# Patient Record
Sex: Female | Born: 1965 | Race: White | Hispanic: No | Smoking: Never smoker
Health system: Southern US, Community
[De-identification: ages and names within clinical notes are randomized; demographics above are authoritative.]

## PROBLEM LIST (undated history)

## (undated) DIAGNOSIS — R51 Headache: Secondary | ICD-10-CM

## (undated) DIAGNOSIS — K589 Irritable bowel syndrome without diarrhea: Secondary | ICD-10-CM

## (undated) DIAGNOSIS — T7840XA Allergy, unspecified, initial encounter: Secondary | ICD-10-CM

## (undated) DIAGNOSIS — R519 Headache, unspecified: Secondary | ICD-10-CM

## (undated) HISTORY — DX: Allergy, unspecified, initial encounter: T78.40XA

## (undated) HISTORY — PX: TOTAL HIP ARTHROPLASTY: SHX124

## (undated) HISTORY — DX: Headache, unspecified: R51.9

## (undated) HISTORY — DX: Headache: R51

## (undated) HISTORY — PX: JOINT REPLACEMENT: SHX530

## (undated) HISTORY — DX: Irritable bowel syndrome, unspecified: K58.9

---

## 2004-01-20 ENCOUNTER — Ambulatory Visit (HOSPITAL_COMMUNITY): Admission: RE | Admit: 2004-01-20 | Discharge: 2004-01-20 | Payer: Self-pay | Admitting: Unknown Physician Specialty

## 2005-03-10 ENCOUNTER — Ambulatory Visit (HOSPITAL_COMMUNITY): Admission: RE | Admit: 2005-03-10 | Discharge: 2005-03-10 | Payer: Self-pay | Admitting: Unknown Physician Specialty

## 2006-01-19 ENCOUNTER — Emergency Department (HOSPITAL_COMMUNITY): Admission: EM | Admit: 2006-01-19 | Discharge: 2006-01-19 | Payer: Self-pay | Admitting: Emergency Medicine

## 2010-11-08 ENCOUNTER — Encounter: Payer: Self-pay | Admitting: Specialist

## 2014-05-09 ENCOUNTER — Encounter: Payer: Self-pay | Admitting: *Deleted

## 2014-11-28 LAB — LIPID PANEL
CHOLESTEROL: 215 — AB (ref 0–200)
HDL: 82 — AB (ref 35–70)
LDL Cholesterol: 123
TRIGLYCERIDES: 51 (ref 40–160)

## 2014-11-28 LAB — TSH: TSH: 1.53 (ref 0.41–5.90)

## 2014-11-28 LAB — BASIC METABOLIC PANEL
BUN: 14 (ref 4–21)
Creatinine: 0.8 (ref 0.5–1.1)
Glucose: 81
POTASSIUM: 5.6 — AB (ref 3.4–5.3)
SODIUM: 138 (ref 137–147)

## 2014-11-28 LAB — HEPATIC FUNCTION PANEL
ALT: 27 (ref 7–35)
AST: 22 (ref 13–35)
Alkaline Phosphatase: 36 (ref 25–125)
Bilirubin, Direct: 0.2 (ref 0.01–0.4)
Bilirubin, Total: 1.6

## 2014-11-28 LAB — HM PAP SMEAR: HM Pap smear: NORMAL

## 2016-04-26 DIAGNOSIS — M545 Low back pain: Secondary | ICD-10-CM | POA: Diagnosis not present

## 2016-04-26 DIAGNOSIS — M25551 Pain in right hip: Secondary | ICD-10-CM | POA: Diagnosis not present

## 2016-05-30 DIAGNOSIS — F4310 Post-traumatic stress disorder, unspecified: Secondary | ICD-10-CM | POA: Diagnosis not present

## 2016-06-10 DIAGNOSIS — F4310 Post-traumatic stress disorder, unspecified: Secondary | ICD-10-CM | POA: Diagnosis not present

## 2016-06-13 DIAGNOSIS — F4310 Post-traumatic stress disorder, unspecified: Secondary | ICD-10-CM | POA: Diagnosis not present

## 2016-06-20 DIAGNOSIS — F4310 Post-traumatic stress disorder, unspecified: Secondary | ICD-10-CM | POA: Diagnosis not present

## 2016-07-04 DIAGNOSIS — F4310 Post-traumatic stress disorder, unspecified: Secondary | ICD-10-CM | POA: Diagnosis not present

## 2016-07-11 DIAGNOSIS — F4310 Post-traumatic stress disorder, unspecified: Secondary | ICD-10-CM | POA: Diagnosis not present

## 2016-07-18 DIAGNOSIS — F4310 Post-traumatic stress disorder, unspecified: Secondary | ICD-10-CM | POA: Diagnosis not present

## 2016-08-01 DIAGNOSIS — F4310 Post-traumatic stress disorder, unspecified: Secondary | ICD-10-CM | POA: Diagnosis not present

## 2016-08-15 DIAGNOSIS — F4310 Post-traumatic stress disorder, unspecified: Secondary | ICD-10-CM | POA: Diagnosis not present

## 2016-08-29 DIAGNOSIS — F4310 Post-traumatic stress disorder, unspecified: Secondary | ICD-10-CM | POA: Diagnosis not present

## 2016-09-05 DIAGNOSIS — F4310 Post-traumatic stress disorder, unspecified: Secondary | ICD-10-CM | POA: Diagnosis not present

## 2016-09-13 DIAGNOSIS — F4310 Post-traumatic stress disorder, unspecified: Secondary | ICD-10-CM | POA: Diagnosis not present

## 2016-09-20 DIAGNOSIS — F4310 Post-traumatic stress disorder, unspecified: Secondary | ICD-10-CM | POA: Diagnosis not present

## 2016-09-27 DIAGNOSIS — F4310 Post-traumatic stress disorder, unspecified: Secondary | ICD-10-CM | POA: Diagnosis not present

## 2016-10-05 DIAGNOSIS — F4310 Post-traumatic stress disorder, unspecified: Secondary | ICD-10-CM | POA: Diagnosis not present

## 2016-10-12 DIAGNOSIS — F4310 Post-traumatic stress disorder, unspecified: Secondary | ICD-10-CM | POA: Diagnosis not present

## 2016-10-18 DIAGNOSIS — F4310 Post-traumatic stress disorder, unspecified: Secondary | ICD-10-CM | POA: Diagnosis not present

## 2016-10-24 DIAGNOSIS — F4310 Post-traumatic stress disorder, unspecified: Secondary | ICD-10-CM | POA: Diagnosis not present

## 2016-11-14 DIAGNOSIS — F4310 Post-traumatic stress disorder, unspecified: Secondary | ICD-10-CM | POA: Diagnosis not present

## 2016-11-21 DIAGNOSIS — F4310 Post-traumatic stress disorder, unspecified: Secondary | ICD-10-CM | POA: Diagnosis not present

## 2016-11-28 DIAGNOSIS — F4310 Post-traumatic stress disorder, unspecified: Secondary | ICD-10-CM | POA: Diagnosis not present

## 2016-12-05 DIAGNOSIS — F4310 Post-traumatic stress disorder, unspecified: Secondary | ICD-10-CM | POA: Diagnosis not present

## 2016-12-06 DIAGNOSIS — J329 Chronic sinusitis, unspecified: Secondary | ICD-10-CM | POA: Diagnosis not present

## 2016-12-06 DIAGNOSIS — H6591 Unspecified nonsuppurative otitis media, right ear: Secondary | ICD-10-CM | POA: Diagnosis not present

## 2016-12-12 DIAGNOSIS — F4310 Post-traumatic stress disorder, unspecified: Secondary | ICD-10-CM | POA: Diagnosis not present

## 2016-12-19 DIAGNOSIS — F4310 Post-traumatic stress disorder, unspecified: Secondary | ICD-10-CM | POA: Diagnosis not present

## 2017-01-02 DIAGNOSIS — F4310 Post-traumatic stress disorder, unspecified: Secondary | ICD-10-CM | POA: Diagnosis not present

## 2017-01-09 DIAGNOSIS — F4310 Post-traumatic stress disorder, unspecified: Secondary | ICD-10-CM | POA: Diagnosis not present

## 2017-01-16 DIAGNOSIS — F4310 Post-traumatic stress disorder, unspecified: Secondary | ICD-10-CM | POA: Diagnosis not present

## 2017-01-23 DIAGNOSIS — F4310 Post-traumatic stress disorder, unspecified: Secondary | ICD-10-CM | POA: Diagnosis not present

## 2017-01-30 DIAGNOSIS — F4310 Post-traumatic stress disorder, unspecified: Secondary | ICD-10-CM | POA: Diagnosis not present

## 2017-02-06 DIAGNOSIS — F4310 Post-traumatic stress disorder, unspecified: Secondary | ICD-10-CM | POA: Diagnosis not present

## 2017-02-20 DIAGNOSIS — F4310 Post-traumatic stress disorder, unspecified: Secondary | ICD-10-CM | POA: Diagnosis not present

## 2017-02-27 DIAGNOSIS — F4310 Post-traumatic stress disorder, unspecified: Secondary | ICD-10-CM | POA: Diagnosis not present

## 2017-03-03 ENCOUNTER — Ambulatory Visit: Payer: Self-pay | Admitting: Sports Medicine

## 2017-03-06 DIAGNOSIS — F4310 Post-traumatic stress disorder, unspecified: Secondary | ICD-10-CM | POA: Diagnosis not present

## 2017-03-07 ENCOUNTER — Ambulatory Visit (INDEPENDENT_AMBULATORY_CARE_PROVIDER_SITE_OTHER): Payer: BLUE CROSS/BLUE SHIELD | Admitting: Sports Medicine

## 2017-03-07 ENCOUNTER — Encounter: Payer: Self-pay | Admitting: Sports Medicine

## 2017-03-07 VITALS — BP 120/80 | HR 69 | Ht 66.0 in | Wt 161.4 lb

## 2017-03-07 DIAGNOSIS — M545 Low back pain, unspecified: Secondary | ICD-10-CM

## 2017-03-07 DIAGNOSIS — R5383 Other fatigue: Secondary | ICD-10-CM | POA: Insufficient documentation

## 2017-03-07 DIAGNOSIS — M9904 Segmental and somatic dysfunction of sacral region: Secondary | ICD-10-CM | POA: Diagnosis not present

## 2017-03-07 DIAGNOSIS — R5382 Chronic fatigue, unspecified: Secondary | ICD-10-CM | POA: Diagnosis not present

## 2017-03-07 DIAGNOSIS — M9903 Segmental and somatic dysfunction of lumbar region: Secondary | ICD-10-CM

## 2017-03-07 DIAGNOSIS — M9905 Segmental and somatic dysfunction of pelvic region: Secondary | ICD-10-CM

## 2017-03-07 NOTE — Patient Instructions (Signed)
Please perform the exercise program that Jeneen Rinks has prepared for you and gone over in detail on a daily basis.  In addition to the handout you were provided you can access your program through: [email or web address]   Your unique program code is: [email or web address]

## 2017-03-07 NOTE — Progress Notes (Signed)
OFFICE VISIT NOTE Emily Meyer. Meyer, Emily at [facility] [phone]  Emily Meyer - 51 y.o. female MRN [id number]  Date of birth: [birth year]  Visit Date: 03/07/2017  PCP: No primary care provider on file.   Referred by: No ref. provider found  Emily Meyer, CMA acting as scribe for Dr. Paulla Meyer.  SUBJECTIVE:   Chief Complaint  Patient presents with  . Follow-up    low back pain with sciatica   HPI: As below and per problem based documentation when appropriate.  Pt presents today in follow-up of low back pain with sciatica.  Pain started a couple pf months ago.  Pt reports no known injury or trauma.   The pain is described as dull pain in legs and sharp pain in lower back. Pain is rated as 5/10 but varies.  Worsened with standing at work. Legs tend to ache at night. Her legs also ache when sitting.  Improves with 2 different exercises. These provide temporary relief.  Pt is not currently taking any medication to help with the pain.   Other associated symptoms include: none  Pt denies fever, chills. She has night sweats but associates that with hormones.     Review of Systems  Constitutional: Negative for chills and fever.  HENT: Positive for tinnitus.   Respiratory: Negative for shortness of breath and wheezing.   Cardiovascular: Positive for leg swelling (slightly, legs feel heavy). Negative for chest pain.  Musculoskeletal: Positive for back pain. Negative for falls.  Neurological: Positive for dizziness, tingling (feet/toes) and headaches.  Endo/Heme/Allergies: Does not bruise/bleed easily.    Otherwise per HPI.  HISTORY & PERTINENT PRIOR DATA:  No specialty comments available. She reports that she has never smoked. She has never used smokeless tobacco. No results for input(s): HGBA1C, LABURIC in the last 8760 hours. Medications & Allergies reviewed per EMR Patient Active Problem List   Diagnosis Date Noted  . Acute  bilateral low back pain without sciatica 03/07/2017  . Fatigue 03/07/2017   Past Medical History:  Diagnosis Date  . IBS (irritable bowel syndrome)    Family History  Problem Relation Age of Onset  . Hypertension Father   . Diabetes Brother   . Cancer Paternal Aunt        breast   No past surgical history on file. Social History   Occupational History  . Not on file.   Social History Main Topics  . Smoking status: Never Smoker  . Smokeless tobacco: Never Used  . Alcohol use Not on file  . Drug use: Unknown  . Sexual activity: Not on file    OBJECTIVE:  VS:  HT:5\' 6"  (167.6 cm)   WT:161 lb 6.4 oz (73.2 kg)  BMI:26.1    BP:120/80  HR:69bpm  TEMP: ( )  RESP:98 % EXAM: Findings:  WDWN, NAD, Non-toxic appearing Alert & appropriately interactive Not depressed or anxious appearing No increased work of breathing. Pupils are equal. EOM intact without nystagmus No clubbing or cyanosis of the extremities appreciated No significant rashes/lesions/ulcerations overlying the examined area. DP & PT pulses 2+/4.  No significant pretibial edema.  No clubbing or cyanosis Sensation intact to light touch in lower extremities.  Back & Lower Extremities: Bilateral negative straight leg raise.  Although tight. No significant midline tenderness.   TTP over bilateral paraspinal musculature.  Left greater than right Good internal and external rotation of the hips.  OSTEOPATHIC/STRUCTURAL EXAM:   L5 FRS right L3 FRS  left Left on left sacral torsion Right anterior innominate       No results found. ASSESSMENT & PLAN:   Problem List Items Addressed This Visit    Acute bilateral low back pain without sciatica - Primary    Back pain is consistent with functional axial pain.  No red flag symptoms.  PROCEDURE NOTE : OSTEOPATHIC MANIPULATION The decision today to treat with Osteopathic Manipulative Therapy (OMT) was based on physical exam findings. Verbal consent was obtained  after after explanation of risks, benefits and potential side effects, including acute pain flare, post manipulation soreness and need for repeat treatments.  If Cervical manipulation was performed additional time was spent discussing the associated minimal risk of  injury to neurovascular structures.  After consent was obtained manipulation was performed as below:            Regions treated:  Per billing codes          Techniques used:  Direct, Muscle Energy, MFR, HVLA (long lever) and ART The patient tolerated the treatment well and reported Improved symptoms following treatment today. Patient was given medications, exercises, stretches and lifestyle modifications per AVS and verbally.     +++++++++++++++++++++++++++++++++++++++++++++++++++++++++++++++ PROCEDURE NOTE: THERAPEUTIC EXERCISES (97110) 15 minutes spent for Therapeutic exercises as stated in above notes.  This included exercises focusing on stretching, strengthening, focus on core stabilization, thoracic mobility and hip abduction strengthening.  Proper technique shown and discussed handout in great detail with ATC.  All questions were discussed and answered.        Fatigue    Patient had worsening fatigue over the past several months as well as worsening MSK symptoms diffusely.  I believe this is likely due to functional issues but do think it is prudent to rule out metabolic causes.  Labs as below.       Relevant Orders   CBC with Differential/Platelet (Completed)   Comprehensive metabolic panel (Completed)   VITAMIN D 25 Hydroxy (Vit-D Deficiency, Fractures) (Completed)   Ferritin (Completed)    Other Visit Diagnoses    Somatic dysfunction of lumbar region       Somatic dysfunction of sacral region       Somatic dysfunction of pelvis region          Follow-up: Return in about 4 weeks (around 04/04/2017).   CMA/ATC served as Education administrator during this visit. History, Physical, and Plan performed by medical provider. Documentation  and orders reviewed and attested to.      [name], [facility] Physician

## 2017-03-08 LAB — COMPREHENSIVE METABOLIC PANEL
ALT: 31 U/L (ref 0–35)
AST: 24 U/L (ref 0–37)
Albumin: 4.4 g/dL (ref 3.5–5.2)
Alkaline Phosphatase: 32 U/L — ABNORMAL LOW (ref 39–117)
BUN: 22 mg/dL (ref 6–23)
CHLORIDE: 107 meq/L (ref 96–112)
CO2: 24 meq/L (ref 19–32)
Calcium: 9.2 mg/dL (ref 8.4–10.5)
Creatinine, Ser: 0.88 mg/dL (ref 0.40–1.20)
GFR: 71.73 mL/min (ref >60.00)
GLUCOSE: 89 mg/dL (ref 70–99)
POTASSIUM: 4.7 meq/L (ref 3.5–5.1)
SODIUM: 139 meq/L (ref 135–145)
TOTAL PROTEIN: 7 g/dL (ref 6.0–8.3)
Total Bilirubin: 0.7 mg/dL (ref 0.2–1.2)

## 2017-03-08 LAB — CBC WITH DIFFERENTIAL/PLATELET
BASOS PCT: 0.9 % (ref 0.0–3.0)
Basophils Absolute: 0.1 10*3/uL (ref 0.0–0.1)
EOS PCT: 0.8 % (ref 0.0–5.0)
Eosinophils Absolute: 0.1 10*3/uL (ref 0.0–0.7)
HEMATOCRIT: 38.1 % (ref 36.0–46.0)
HEMOGLOBIN: 12.8 g/dL (ref 12.0–15.0)
LYMPHS PCT: 24.9 % (ref 12.0–46.0)
Lymphs Abs: 2.2 10*3/uL (ref 0.7–4.0)
MCHC: 33.6 g/dL (ref 30.0–36.0)
MCV: 93 fl (ref 78.0–100.0)
MONO ABS: 0.6 10*3/uL (ref 0.1–1.0)
MONOS PCT: 7.1 % (ref 3.0–12.0)
Neutro Abs: 5.7 10*3/uL (ref 1.4–7.7)
Neutrophils Relative %: 66.3 % (ref 43.0–77.0)
Platelets: 212 10*3/uL (ref 150.0–400.0)
RBC: 4.1 Mil/uL (ref 3.87–5.11)
RDW: 13.5 % (ref 11.5–15.5)
WBC: 8.7 10*3/uL (ref 4.0–10.5)

## 2017-03-08 LAB — FERRITIN: Ferritin: 23.9 ng/mL (ref 10.0–291.0)

## 2017-03-08 LAB — VITAMIN D 25 HYDROXY (VIT D DEFICIENCY, FRACTURES): VITD: 21.54 ng/mL — ABNORMAL LOW (ref 30.00–100.00)

## 2017-03-09 ENCOUNTER — Telehealth: Payer: Self-pay

## 2017-03-09 MED ORDER — CHOLECALCIFEROL 1.25 MG (50000 UT) PO TABS: 1.0000 | ORAL_TABLET | ORAL | 0 refills | Status: DC

## 2017-03-09 NOTE — Telephone Encounter (Signed)
-----   Message from Gerda Diss, DO sent at 03/09/2017  8:19 AM EDT ----- Vitamin D is low but otherwise labs look good.   Iron is normal.  Needs to start once weekly Vitamin D, 50,000 IU once weekly. I will send to her pharmacy. Should stop her once daily Vit D  Please call and let her know about the results as well as the new rx  Needs f/u Vit D check in 12 weeks

## 2017-03-09 NOTE — Telephone Encounter (Signed)
Called pt. See telephone encounter made ~905am on 03/09/17. Also discussed potential mods to ATC exercises given at last visit. Patient has not yet made f/u appt I notified her of time we would like to see her she will call back when she is ready to schedule.

## 2017-03-13 NOTE — Assessment & Plan Note (Signed)
Patient had worsening fatigue over the past several months as well as worsening MSK symptoms diffusely.  I believe this is likely due to functional issues but do think it is prudent to rule out metabolic causes.  Labs as below.

## 2017-03-13 NOTE — Assessment & Plan Note (Signed)
Back pain is consistent with functional axial pain.  No red flag symptoms.  PROCEDURE NOTE : OSTEOPATHIC MANIPULATION The decision today to treat with Osteopathic Manipulative Therapy (OMT) was based on physical exam findings. Verbal consent was obtained after after explanation of risks, benefits and potential side effects, including acute pain flare, post manipulation soreness and need for repeat treatments.  If Cervical manipulation was performed additional time was spent discussing the associated minimal risk of  injury to neurovascular structures.  After consent was obtained manipulation was performed as below:            Regions treated:  Per billing codes          Techniques used:  Direct, Muscle Energy, MFR, HVLA (long lever) and ART The patient tolerated the treatment well and reported Improved symptoms following treatment today. Patient was given medications, exercises, stretches and lifestyle modifications per AVS and verbally.     +++++++++++++++++++++++++++++++++++++++++++++++++++++++++++++++ PROCEDURE NOTE: THERAPEUTIC EXERCISES (97110) 15 minutes spent for Therapeutic exercises as stated in above notes.  This included exercises focusing on stretching, strengthening, focus on core stabilization, thoracic mobility and hip abduction strengthening.  Proper technique shown and discussed handout in great detail with ATC.  All questions were discussed and answered.

## 2017-03-16 ENCOUNTER — Telehealth: Payer: Self-pay | Admitting: Emergency Medicine

## 2017-03-16 ENCOUNTER — Encounter: Payer: Self-pay | Admitting: Emergency Medicine

## 2017-03-16 NOTE — Telephone Encounter (Signed)
Patient declines any immunization   Not ready to get a mammogram or colonoscopy   Patient states last PAP Smear was several years ago and is willing to have done in office.

## 2017-03-17 ENCOUNTER — Ambulatory Visit (INDEPENDENT_AMBULATORY_CARE_PROVIDER_SITE_OTHER): Payer: BLUE CROSS/BLUE SHIELD | Admitting: Family Medicine

## 2017-03-17 ENCOUNTER — Encounter: Payer: Self-pay | Admitting: Family Medicine

## 2017-03-17 VITALS — HR 67 | Temp 97.9°F | Ht 66.0 in | Wt 157.4 lb

## 2017-03-17 DIAGNOSIS — H6981 Other specified disorders of Eustachian tube, right ear: Secondary | ICD-10-CM

## 2017-03-17 DIAGNOSIS — H6991 Unspecified Eustachian tube disorder, right ear: Secondary | ICD-10-CM

## 2017-03-17 DIAGNOSIS — H9201 Otalgia, right ear: Secondary | ICD-10-CM | POA: Diagnosis not present

## 2017-03-17 DIAGNOSIS — H9311 Tinnitus, right ear: Secondary | ICD-10-CM | POA: Diagnosis not present

## 2017-03-17 DIAGNOSIS — H9313 Tinnitus, bilateral: Secondary | ICD-10-CM | POA: Insufficient documentation

## 2017-03-17 MED ORDER — AZITHROMYCIN 500 MG PO TABS: 500.0000 mg | ORAL_TABLET | Freq: Every day | ORAL | 0 refills | Status: DC

## 2017-03-17 MED ORDER — PREDNISONE 5 MG PO TABS: ORAL_TABLET | ORAL | 0 refills | Status: DC

## 2017-03-17 NOTE — Progress Notes (Signed)
Emily Meyer is a 51 y.o. female is here to [facility].   Patient Care Team: Briscoe Deutscher, DO as PCP - General (Family Medicine)   History of Present Illness:   Emily Meyer CMA acting as scribe for Dr. Juleen China.  HPI Patient comes in today to establish care. She is having some right ear ringing that she first noticed in November 2017. She was having some ear pain that radiated down the right face, but that has gotten better. It has caused some headache on the right temporal area. She has been on prednisone and antibiotics that has not helped in the past.   Otalgia Patient presents with right ear pain. The patient reports chronic sinus infections for a few weeks.  Her symptoms include nasal congestion, sniffing, frequent clearing of the throat, facial pain.  There has not been a history of cough, fevers, sore throats, epistaxis, spitting/vomiting mucous. There has not been a history of chronic otitis media or pharyngotonsillitis.  Prior antibiotic therapy has included Amoxicillin. Other medications have included Flonase, Zyrtec, and Prednisone.  She has not had allergy testing.  Health Maintenance Due  Topic Date Due  . HIV Screening  03/19/1980  . TETANUS/TDAP  03/19/1984  . PAP SMEAR  03/19/1986  . MAMMOGRAM  03/20/2015  . COLONOSCOPY  03/20/2015    PMHx, SurgHx, SocialHx, Medications, and Allergies were reviewed in the Visit Navigator and updated as appropriate.   Past Medical History:  Diagnosis Date  . IBS (irritable bowel syndrome)    No past surgical history on file.  Family History  Problem Relation Age of Onset  . Hypertension Father   . Diabetes Brother   . Cancer Paternal Aunt        breast   Social History  Substance Use Topics  . Smoking status: Never Smoker  . Smokeless tobacco: Never Used  . Alcohol use No    Current Medications and Allergies:   .  b complex vitamins tablet, Take 1 tablet by mouth daily., Disp: , Rfl:  .  Cholecalciferol 50000  units TABS, Take 1 tablet by mouth once a week., Disp: 12 tablet, Rfl: 0 .  Flaxseed, Linseed, (EQL FLAX SEED OIL) 1000 MG CAPS, Take 1,000 mg by mouth 2 (two) times daily., Disp: , Rfl:  .  Magnesium Oxide (MAG-CAPS PO), Take 1 capsule by mouth daily., Disp: , Rfl:  .  vitamin A 10000 UNIT capsule, Take 10,000 Units by mouth daily., Disp: , Rfl:   Allergies  Allergen Reactions  . Adhesive [Tape]     rash   Review of Systems:   Review of Systems  Constitutional: Positive for malaise/fatigue. Negative for chills and fever.  HENT: Positive for ear pain. Negative for sinus pain and sore throat.   Eyes: Negative for blurred vision and double vision.  Respiratory: Negative for cough, shortness of breath and wheezing.   Cardiovascular: Negative for chest pain, palpitations and leg swelling.  Gastrointestinal: Negative for abdominal pain, nausea and vomiting.  Musculoskeletal: Negative for back pain, joint pain and neck pain.  Neurological: Positive for dizziness and headaches.  Psychiatric/Behavioral: Negative for depression, hallucinations and memory loss.    Vitals:   Vitals:   03/17/17 0739  Pulse: 67  Temp: 97.9 F (36.6 C)  TempSrc: Oral  SpO2: 99%  Weight: 157 lb 6.4 oz (71.4 kg)  Height: 5\' 6"  (1.676 m)     Body mass index is 25.41 kg/m.  Physical Exam:   Physical Exam  Results for  orders placed or performed in visit on 03/07/17  CBC with Differential/Platelet  Result Value Ref Range   WBC 8.7 4.0 - 10.5 K/uL   RBC 4.10 3.87 - 5.11 Mil/uL   Hemoglobin 12.8 12.0 - 15.0 g/dL   HCT 38.1 36.0 - 46.0 %   MCV 93.0 78.0 - 100.0 fl   MCHC 33.6 30.0 - 36.0 g/dL   RDW 13.5 11.5 - 15.5 %   Platelets 212.0 150.0 - 400.0 K/uL   Neutrophils Relative % 66.3 43.0 - 77.0 %   Lymphocytes Relative 24.9 12.0 - 46.0 %   Monocytes Relative 7.1 3.0 - 12.0 %   Eosinophils Relative 0.8 0.0 - 5.0 %   Basophils Relative 0.9 0.0 - 3.0 %   Neutro Abs 5.7 1.4 - 7.7 K/uL   Lymphs Abs  2.2 0.7 - 4.0 K/uL   Monocytes Absolute 0.6 0.1 - 1.0 K/uL   Eosinophils Absolute 0.1 0.0 - 0.7 K/uL   Basophils Absolute 0.1 0.0 - 0.1 K/uL  Comprehensive metabolic panel  Result Value Ref Range   Sodium 139 135 - 145 mEq/L   Potassium 4.7 3.5 - 5.1 mEq/L   Chloride 107 96 - 112 mEq/L   CO2 24 19 - 32 mEq/L   Glucose, Bld 89 70 - 99 mg/dL   BUN 22 6 - 23 mg/dL   Creatinine, Ser 0.88 0.40 - 1.20 mg/dL   Total Bilirubin 0.7 0.2 - 1.2 mg/dL   Alkaline Phosphatase 32 (L) 39 - 117 U/L   AST 24 0 - 37 U/L   ALT 31 0 - 35 U/L   Total Protein 7.0 6.0 - 8.3 g/dL   Albumin 4.4 3.5 - 5.2 g/dL   Calcium 9.2 8.4 - 10.5 mg/dL   GFR 71.73 >60.00 mL/min  VITAMIN D 25 Hydroxy (Vit-D Deficiency, Fractures)  Result Value Ref Range   VITD 21.54 (L) 30.00 - 100.00 ng/mL  Ferritin  Result Value Ref Range   Ferritin 23.9 10.0 - 291.0 ng/mL    Assessment and Plan:   Hibo was seen today for establish care and tinnitus.  Diagnoses and all orders for this visit:  Otalgia of right ear Comments: We reviewed symptomatic care and red flags.   Tinnitus of right ear Comments: Hearing tested today and WNL.  ETD (Eustachian tube dysfunction), right Comments: We reviewed environmental precautions, OTC medications, red flags. To ENT if not improving.  Orders: -     azithromycin (ZITHROMAX) 500 MG tablet; Take 1 tablet (500 mg total) by mouth daily. -     predniSONE (DELTASONE) 5 MG tablet; 6-5-4-3-2-1-off    . Reviewed expectations re: course of current medical issues. . Discussed self-management of symptoms. . Outlined signs and symptoms indicating need for more acute intervention. . Patient verbalized understanding and all questions were answered. Marland Kitchen Health Maintenance issues including appropriate healthy diet, exercise, and smoking avoidance were discussed with patient. . See orders for this visit as documented in the electronic medical record. . Patient received an After Visit  Summary.  CMA served as Education administrator during this visit. History, Physical, and Plan performed by medical provider. The above documentation has been reviewed and is accurate and complete. Briscoe Deutscher, D.O.  Briscoe Deutscher, DO [place] [place] 03/17/2017  No future appointments.

## 2017-03-20 ENCOUNTER — Telehealth: Payer: Self-pay | Admitting: Family Medicine

## 2017-03-20 NOTE — Telephone Encounter (Signed)
ROI fax to [facility] °

## 2017-03-27 DIAGNOSIS — F4310 Post-traumatic stress disorder, unspecified: Secondary | ICD-10-CM | POA: Diagnosis not present

## 2017-04-04 ENCOUNTER — Encounter: Payer: Self-pay | Admitting: Family Medicine

## 2017-04-04 LAB — T3, FREE: FREE T3: 2.96

## 2017-04-04 LAB — T4: Thyroxine (T4): 7.5

## 2017-04-10 DIAGNOSIS — F4310 Post-traumatic stress disorder, unspecified: Secondary | ICD-10-CM | POA: Diagnosis not present

## 2017-04-24 DIAGNOSIS — F4310 Post-traumatic stress disorder, unspecified: Secondary | ICD-10-CM | POA: Diagnosis not present

## 2017-05-19 ENCOUNTER — Other Ambulatory Visit: Payer: Self-pay

## 2017-05-19 ENCOUNTER — Telehealth: Payer: Self-pay | Admitting: Sports Medicine

## 2017-05-19 ENCOUNTER — Telehealth: Payer: Self-pay | Admitting: Family Medicine

## 2017-05-19 DIAGNOSIS — E559 Vitamin D deficiency, unspecified: Secondary | ICD-10-CM

## 2017-05-19 DIAGNOSIS — H9201 Otalgia, right ear: Secondary | ICD-10-CM

## 2017-05-19 NOTE — Telephone Encounter (Signed)
Patient wants to know if she can just sched a lab visit to check Vit D level, or if Dr. [name] wants to see her when it's time for her to check it?  Thank you,  -LL

## 2017-05-19 NOTE — Telephone Encounter (Signed)
OK to schedule visit for LAB only unless pt needs/wants to see Dr. [name].

## 2017-05-19 NOTE — Telephone Encounter (Signed)
Wants a referral to ENT as well as a suggested location.   Please call back at work number I've listed.  Thank you,  -LL

## 2017-05-19 NOTE — Telephone Encounter (Signed)
I've place an ENT referral.  Unsure where she will be referred to.  Would you mind calling the patient to advise her of location?  Thanks!

## 2017-05-22 DIAGNOSIS — F4310 Post-traumatic stress disorder, unspecified: Secondary | ICD-10-CM | POA: Diagnosis not present

## 2017-05-23 NOTE — Telephone Encounter (Signed)
Patient unfortunately never received an update on this referral. Patient lives in [place] and works near the [employer] office. Patient would like a referral as soon as possible to an ENT due to the problem getting worse. Call patient with any further questions and to keep updated.

## 2017-05-23 NOTE — Telephone Encounter (Signed)
Future orders have been placed.

## 2017-05-23 NOTE — Telephone Encounter (Signed)
Can you please help with this referral?  ?

## 2017-05-23 NOTE — Telephone Encounter (Signed)
Patient was unfortunately never updated on the note below, patient will call when she is ready to do the labs for her vitamin D levels.

## 2017-06-05 DIAGNOSIS — F4310 Post-traumatic stress disorder, unspecified: Secondary | ICD-10-CM | POA: Diagnosis not present

## 2017-06-15 ENCOUNTER — Other Ambulatory Visit: Payer: Self-pay | Admitting: Otolaryngology

## 2017-06-15 DIAGNOSIS — J324 Chronic pansinusitis: Secondary | ICD-10-CM

## 2017-06-15 DIAGNOSIS — H9313 Tinnitus, bilateral: Secondary | ICD-10-CM | POA: Diagnosis not present

## 2017-06-15 DIAGNOSIS — R1314 Dysphagia, pharyngoesophageal phase: Secondary | ICD-10-CM | POA: Diagnosis not present

## 2017-06-15 DIAGNOSIS — R42 Dizziness and giddiness: Secondary | ICD-10-CM | POA: Diagnosis not present

## 2017-06-21 DIAGNOSIS — F4310 Post-traumatic stress disorder, unspecified: Secondary | ICD-10-CM | POA: Diagnosis not present

## 2017-06-27 DIAGNOSIS — R51 Headache: Secondary | ICD-10-CM | POA: Diagnosis not present

## 2017-07-24 DIAGNOSIS — F4310 Post-traumatic stress disorder, unspecified: Secondary | ICD-10-CM | POA: Diagnosis not present

## 2017-08-07 DIAGNOSIS — F4310 Post-traumatic stress disorder, unspecified: Secondary | ICD-10-CM | POA: Diagnosis not present

## 2017-08-14 DIAGNOSIS — F4310 Post-traumatic stress disorder, unspecified: Secondary | ICD-10-CM | POA: Diagnosis not present

## 2017-08-21 DIAGNOSIS — F4310 Post-traumatic stress disorder, unspecified: Secondary | ICD-10-CM | POA: Diagnosis not present

## 2017-09-04 DIAGNOSIS — F4310 Post-traumatic stress disorder, unspecified: Secondary | ICD-10-CM | POA: Diagnosis not present

## 2017-09-20 DIAGNOSIS — F4310 Post-traumatic stress disorder, unspecified: Secondary | ICD-10-CM | POA: Diagnosis not present

## 2017-10-02 DIAGNOSIS — F4310 Post-traumatic stress disorder, unspecified: Secondary | ICD-10-CM | POA: Diagnosis not present

## 2017-10-04 ENCOUNTER — Ambulatory Visit: Payer: BLUE CROSS/BLUE SHIELD | Admitting: Neurology

## 2017-10-05 ENCOUNTER — Encounter: Payer: Self-pay | Admitting: Neurology

## 2017-10-05 ENCOUNTER — Ambulatory Visit: Payer: BLUE CROSS/BLUE SHIELD | Admitting: Neurology

## 2017-10-05 DIAGNOSIS — R51 Headache: Secondary | ICD-10-CM | POA: Diagnosis not present

## 2017-10-05 DIAGNOSIS — R519 Headache, unspecified: Secondary | ICD-10-CM | POA: Insufficient documentation

## 2017-10-05 NOTE — Progress Notes (Signed)
PATIENT: Emily Meyer DOB: 08-Jun-1966  Chief Complaint  Patient presents with  . Trigeminal Neuralgia    She is here due to right-sided facial pain, ringing in ear and headaches unresolved with steroids and antibiotics.  . Otolaryngology    Melissa Montane, MD (referring MD) --- Briscoe Deutscher, DO (PCP)     HISTORICAL  Emily Meyer is a 51 years old female, seen in refer by ENT Dr. Janace Hoard, Jenny Reichmann for evaluation of right facial pressure initial evaluation was on October 05, 2017  Since 2017, she began to notice right ear deep achy pain that eventually improved, afterwards, she also noticed tinnitus at the right ear, pressure sensation in her right cheekbone, drainage at right posterior oropharyngeal, symptoms fluctuate sometimes to the point of nausea, overall her symptoms has much improved,  She denies visual loss, no chewing difficulty, was seen by ENT, no significant abnormality found,  MRI of the brain with and without contrast at [place] [facility] on June 28, 2017, there was no acute abnormality  Denies right facial radiating pain, no headaches,  REVIEW OF SYSTEMS: Full 14 system review of systems performed and notable only for ringing the ears, achy muscles, runny nose, headaches, snoring  ALLERGIES: Allergies  Allergen Reactions  . Adhesive [Tape]     rash    HOME MEDICATIONS: Current Outpatient Medications  Medication Sig Dispense Refill  . b complex vitamins tablet Take 1 tablet by mouth daily.    . Flaxseed, Linseed, (EQL FLAX SEED OIL) 1000 MG CAPS Take 1,000 mg by mouth 2 (two) times daily.    . Magnesium Oxide (MAG-CAPS PO) Take 1 capsule by mouth daily.    . vitamin A 10000 UNIT capsule Take 10,000 Units by mouth daily.     No current facility-administered medications for this visit.     PAST MEDICAL HISTORY: Past Medical History:  Diagnosis Date  . IBS (irritable bowel syndrome)   . Right sided facial pain     PAST SURGICAL HISTORY: History  reviewed. No pertinent surgical history.  FAMILY HISTORY: Family History  Problem Relation Age of Onset  . Healthy Mother   . Hypertension Father        aortic aneurysm  . Diabetes Brother   . Cancer Paternal Aunt        breast    SOCIAL HISTORY:  Social History   Socioeconomic History  . Marital status: Divorced    Spouse name: Not on file  . Number of children: 2  . Years of education: 36  . Highest education level: Associate degree: occupational, Hotel manager, or vocational program  Social Needs  . Financial resource strain: Not on file  . Food insecurity - worry: Not on file  . Food insecurity - inability: Not on file  . Transportation needs - medical: Not on file  . Transportation needs - non-medical: Not on file  Occupational History  . Not on file  Tobacco Use  . Smoking status: Never Smoker  . Smokeless tobacco: Never Used  Substance and Sexual Activity  . Alcohol use: No  . Drug use: No  . Sexual activity: No  Other Topics Concern  . Not on file  Social History Narrative   Lives at home with her husband.   Right-handed.   No caffeine use.     PHYSICAL EXAM   Vitals:   10/05/17 1007  BP: 120/73  Pulse: 73  Weight: 167 lb 8 oz (76 kg)  Height: 5\' 6"  (1.676 m)  Not recorded      Body mass index is 27.04 kg/m.  PHYSICAL EXAMNIATION:  Gen: NAD, conversant, well nourised, obese, well groomed                     Cardiovascular: Regular rate rhythm, no peripheral edema, warm, nontender. Eyes: Conjunctivae clear without exudates or hemorrhage Neck: Supple, no carotid bruits. Pulmonary: Clear to auscultation bilaterally   NEUROLOGICAL EXAM:  MENTAL STATUS: Speech:    Speech is normal; fluent and spontaneous with normal comprehension.  Cognition:     Orientation to time, place and person     Normal recent and remote memory     Normal Attention span and concentration     Normal Language, naming, repeating,spontaneous speech     Fund of  knowledge   CRANIAL NERVES: CN II: Visual fields are full to confrontation. Fundoscopic exam is normal with sharp discs and no vascular changes. Pupils are round equal and briskly reactive to light. CN III, IV, VI: extraocular movement are normal. No ptosis. CN V: Facial sensation is intact to pinprick in all 3 divisions bilaterally. Corneal responses are intact.  CN VII: Face is symmetric with normal eye closure and smile. CN VIII: Hearing is normal to rubbing fingers CN IX, X: Palate elevates symmetrically. Phonation is normal. CN XI: Head turning and shoulder shrug are intact CN XII: Tongue is midline with normal movements and no atrophy.  MOTOR: There is no pronator drift of out-stretched arms. Muscle bulk and tone are normal. Muscle strength is normal.  REFLEXES: Reflexes are 2+ and symmetric at the biceps, triceps, knees, and ankles. Plantar responses are flexor.  SENSORY: Intact to light touch, pinprick, positional sensation and vibratory sensation are intact in fingers and toes.  COORDINATION: Rapid alternating movements and fine finger movements are intact. There is no dysmetria on finger-to-nose and heel-knee-shin.    GAIT/STANCE: Posture is normal. Gait is steady with normal steps, base, arm swing, and turning. Heel and toe walking are normal. Tandem gait is normal.  Romberg is absent.   DIAGNOSTIC DATA (LABS, IMAGING, TESTING) - I reviewed patient records, labs, notes, testing and imaging myself where available.   ASSESSMENT AND PLAN  Emily Meyer is a 51 y.o. female   Right facial pressure pain, Vitamin D deficiency  Normal neurological examination  Laboratory evaluation to rule out inflammatory process   Marcial Pacas, M.D. Ph.D.  [facility] [address], [place], [place] [address] Ph: [phone] Fax: [phone]  PF:[id number], Jenny Reichmann, MD

## 2017-10-06 ENCOUNTER — Encounter: Payer: Self-pay | Admitting: Sports Medicine

## 2017-10-06 ENCOUNTER — Other Ambulatory Visit: Payer: Self-pay | Admitting: Sports Medicine

## 2017-10-06 LAB — CBC WITH DIFFERENTIAL
BASOS: 1 %
Basophils Absolute: 0 10*3/uL (ref 0.0–0.2)
EOS (ABSOLUTE): 0.1 10*3/uL (ref 0.0–0.4)
EOS: 1 %
HEMATOCRIT: 37.3 % (ref 34.0–46.6)
HEMOGLOBIN: 12.4 g/dL (ref 11.1–15.9)
IMMATURE GRANULOCYTES: 0 %
Immature Grans (Abs): 0 10*3/uL (ref 0.0–0.1)
LYMPHS ABS: 1.4 10*3/uL (ref 0.7–3.1)
Lymphs: 22 %
MCH: 30.4 pg (ref 26.6–33.0)
MCHC: 33.2 g/dL (ref 31.5–35.7)
MCV: 91 fL (ref 79–97)
MONOCYTES: 6 %
Monocytes Absolute: 0.4 10*3/uL (ref 0.1–0.9)
Neutrophils Absolute: 4.5 10*3/uL (ref 1.4–7.0)
Neutrophils: 70 %
RBC: 4.08 x10E6/uL (ref 3.77–5.28)
RDW: 13.9 % (ref 12.3–15.4)
WBC: 6.3 10*3/uL (ref 3.4–10.8)

## 2017-10-06 LAB — TSH: TSH: 0.687 u[IU]/mL (ref 0.450–4.500)

## 2017-10-06 LAB — COMPREHENSIVE METABOLIC PANEL
ALBUMIN: 4.4 g/dL (ref 3.5–5.5)
ALK PHOS: 44 IU/L (ref 39–117)
ALT: 30 IU/L (ref 0–32)
AST: 21 IU/L (ref 0–40)
Albumin/Globulin Ratio: 1.9 (ref 1.2–2.2)
BUN / CREAT RATIO: 27 — AB (ref 9–23)
BUN: 19 mg/dL (ref 6–24)
Bilirubin Total: 0.6 mg/dL (ref 0.0–1.2)
CALCIUM: 9.3 mg/dL (ref 8.7–10.2)
CO2: 24 mmol/L (ref 20–29)
CREATININE: 0.7 mg/dL (ref 0.57–1.00)
Chloride: 104 mmol/L (ref 96–106)
GFR calc Af Amer: 115 mL/min/{1.73_m2} (ref >59)
GFR, EST NON AFRICAN AMERICAN: 100 mL/min/{1.73_m2} (ref >59)
GLUCOSE: 95 mg/dL (ref 65–99)
Globulin, Total: 2.3 g/dL (ref 1.5–4.5)
Potassium: 5.1 mmol/L (ref 3.5–5.2)
Sodium: 140 mmol/L (ref 134–144)
Total Protein: 6.7 g/dL (ref 6.0–8.5)

## 2017-10-06 LAB — RPR: RPR: NONREACTIVE

## 2017-10-06 LAB — VITAMIN B12: Vitamin B-12: 1086 pg/mL (ref 232–1245)

## 2017-10-06 LAB — FOLATE: Folate: 15.3 ng/mL (ref >3.0)

## 2017-10-06 LAB — VITAMIN D 25 HYDROXY (VIT D DEFICIENCY, FRACTURES): Vit D, 25-Hydroxy: 26.5 ng/mL — ABNORMAL LOW (ref 30.0–100.0)

## 2017-10-06 LAB — C-REACTIVE PROTEIN: CRP: 1.2 mg/L (ref 0.0–4.9)

## 2017-10-06 LAB — SEDIMENTATION RATE: SED RATE: 2 mm/h (ref 0–40)

## 2017-10-06 LAB — ANA W/REFLEX IF POSITIVE: Anti Nuclear Antibody(ANA): NEGATIVE

## 2017-10-06 MED ORDER — CHOLECALCIFEROL 1.25 MG (50000 UT) PO TABS: 1.0000 | ORAL_TABLET | ORAL | 0 refills | Status: DC

## 2017-10-18 DIAGNOSIS — F4310 Post-traumatic stress disorder, unspecified: Secondary | ICD-10-CM | POA: Diagnosis not present

## 2017-10-26 DIAGNOSIS — F4310 Post-traumatic stress disorder, unspecified: Secondary | ICD-10-CM | POA: Diagnosis not present

## 2017-11-13 DIAGNOSIS — F4310 Post-traumatic stress disorder, unspecified: Secondary | ICD-10-CM | POA: Diagnosis not present

## 2017-11-27 DIAGNOSIS — F4310 Post-traumatic stress disorder, unspecified: Secondary | ICD-10-CM | POA: Diagnosis not present

## 2017-12-07 DIAGNOSIS — F4310 Post-traumatic stress disorder, unspecified: Secondary | ICD-10-CM | POA: Diagnosis not present

## 2017-12-11 DIAGNOSIS — F4310 Post-traumatic stress disorder, unspecified: Secondary | ICD-10-CM | POA: Diagnosis not present

## 2017-12-25 DIAGNOSIS — F4310 Post-traumatic stress disorder, unspecified: Secondary | ICD-10-CM | POA: Diagnosis not present

## 2018-01-01 DIAGNOSIS — F4310 Post-traumatic stress disorder, unspecified: Secondary | ICD-10-CM | POA: Diagnosis not present

## 2018-01-15 DIAGNOSIS — F4310 Post-traumatic stress disorder, unspecified: Secondary | ICD-10-CM | POA: Diagnosis not present

## 2018-01-22 DIAGNOSIS — F4310 Post-traumatic stress disorder, unspecified: Secondary | ICD-10-CM | POA: Diagnosis not present

## 2018-01-29 DIAGNOSIS — F4310 Post-traumatic stress disorder, unspecified: Secondary | ICD-10-CM | POA: Diagnosis not present

## 2018-02-22 DIAGNOSIS — M25551 Pain in right hip: Secondary | ICD-10-CM | POA: Diagnosis not present

## 2018-02-26 DIAGNOSIS — F4310 Post-traumatic stress disorder, unspecified: Secondary | ICD-10-CM | POA: Diagnosis not present

## 2018-03-14 DIAGNOSIS — F4310 Post-traumatic stress disorder, unspecified: Secondary | ICD-10-CM | POA: Diagnosis not present

## 2018-03-26 DIAGNOSIS — F4310 Post-traumatic stress disorder, unspecified: Secondary | ICD-10-CM | POA: Diagnosis not present

## 2018-04-09 DIAGNOSIS — F4310 Post-traumatic stress disorder, unspecified: Secondary | ICD-10-CM | POA: Diagnosis not present

## 2018-04-22 ENCOUNTER — Encounter: Payer: Self-pay | Admitting: Family Medicine

## 2018-04-23 DIAGNOSIS — F4310 Post-traumatic stress disorder, unspecified: Secondary | ICD-10-CM | POA: Diagnosis not present

## 2018-04-24 ENCOUNTER — Telehealth: Payer: Self-pay | Admitting: Surgical

## 2018-04-24 NOTE — Telephone Encounter (Signed)
Called patient to schedule her yearly physical. Patient stated that now was not a good time and she would call back when it was a better time to come in.

## 2018-05-07 DIAGNOSIS — F4310 Post-traumatic stress disorder, unspecified: Secondary | ICD-10-CM | POA: Diagnosis not present

## 2018-05-21 DIAGNOSIS — F4310 Post-traumatic stress disorder, unspecified: Secondary | ICD-10-CM | POA: Diagnosis not present

## 2018-06-04 DIAGNOSIS — F4310 Post-traumatic stress disorder, unspecified: Secondary | ICD-10-CM | POA: Diagnosis not present

## 2018-06-20 DIAGNOSIS — F4310 Post-traumatic stress disorder, unspecified: Secondary | ICD-10-CM | POA: Diagnosis not present

## 2018-06-22 ENCOUNTER — Encounter: Payer: Self-pay | Admitting: Sports Medicine

## 2018-06-22 ENCOUNTER — Ambulatory Visit (INDEPENDENT_AMBULATORY_CARE_PROVIDER_SITE_OTHER): Payer: BLUE CROSS/BLUE SHIELD

## 2018-06-22 ENCOUNTER — Ambulatory Visit: Payer: BLUE CROSS/BLUE SHIELD | Admitting: Sports Medicine

## 2018-06-22 VITALS — BP 126/80 | HR 76 | Temp 98.9°F | Ht 65.0 in | Wt 167.4 lb

## 2018-06-22 DIAGNOSIS — E559 Vitamin D deficiency, unspecified: Secondary | ICD-10-CM | POA: Insufficient documentation

## 2018-06-22 DIAGNOSIS — M25561 Pain in right knee: Secondary | ICD-10-CM | POA: Diagnosis not present

## 2018-06-22 DIAGNOSIS — R5382 Chronic fatigue, unspecified: Secondary | ICD-10-CM

## 2018-06-22 DIAGNOSIS — M25562 Pain in left knee: Secondary | ICD-10-CM

## 2018-06-22 LAB — VITAMIN D 25 HYDROXY (VIT D DEFICIENCY, FRACTURES): VITD: 21.22 ng/mL — ABNORMAL LOW (ref 30.00–100.00)

## 2018-06-22 NOTE — Progress Notes (Signed)
[name]. [name], [name] [facility] [id number]  [name] - 52 y.o. female MRN [id number]  Date of birth: 1966/10/08  Visit Date: 06/22/2018  PCP: Briscoe Deutscher, DO   Referred by: Briscoe Deutscher, DO  Scribe(s) for today's visit: [name], CMA  SUBJECTIVE:  Emily Meyer is here for Follow-up (LBP)   Her B knee pain, L>R symptoms INITIALLY: Began about 1 week ago and MOI unknown. She's had off and on knee pain over the past several years.  Described as moderate pain and swelling, non-radiating. Worsened with stairs. Improved with rest, regular exercise.  Additional associated symptoms include: She denies clicking, popping, swelling.     At this time symptoms are worsening compared to onset  She has not tried any medication or other modalities for the pain.   LBP has improved. She has occasional aching in her legs.    REVIEW OF SYSTEMS: Reports night time disturbances. Denies fevers, chills, or night sweats. Denies unexplained weight loss. Denies personal history of cancer. Denies changes in bowel or bladder habits. Denies recent unreported falls. Denies new or worsening dyspnea or wheezing. Reports headaches or dizziness.  Denies numbness, tingling or weakness  In the extremities.  Denies dizziness or presyncopal episodes Denies lower extremity edema     HISTORY:  Prior history reviewed and updated per electronic medical record.  Social History   Occupational History  . Occupation: Therapist, art Rep    Employer: [employer]  Tobacco Use  . Smoking status: Never Smoker  . Smokeless tobacco: Never Used  Substance and Sexual Activity  . Alcohol use: No  . Drug use: No  . Sexual activity: Never   Social History   Social History Narrative   Lives at home with her husband.   Right-handed.   No caffeine use.    DATA OBTAINED & REVIEWED:  No results for input(s): HGBA1C, LABURIC,  CREATINE in the last 8760 hours. . 06/22/2018: 3 views bilateral knees: Unremarkable, no significant degenerative change. .   OBJECTIVE:  VS:  HT:5\' 5"  (165.1 cm)   WT:167 lb 6.4 oz (75.9 kg)  BMI:27.86    BP:126/80  HR:76bpm  TEMP:98.9 F (37.2 C)(Oral)  RESP:98 %   PHYSICAL EXAM: CONSTITUTIONAL: Well-developed, Well-nourished and In no acute distress PSYCHIATRIC: Alert & appropriately interactive. and Not depressed or anxious appearing. RESPIRATORY: No increased work of breathing and Trachea Midline EYES: Pupils are equal., EOM intact without nystagmus. and No scleral icterus.  VASCULAR EXAM: Warm and well perfused NEURO: unremarkable  MSK Exam: Bilateral legs and knees  Well aligned, no significant deformity. No overlying skin changes. Generalized tenderness without focal bony tenderness.  Bilateral medial and lateral joint line pain.  No significant effusion.   RANGE OF MOTION & STRENGTH  Normal knee arc.  Good internal and external rotation of bilateral hips.  Negative straight leg raise bilaterally.   SPECIALITY TESTING:  Knees are ligamentously stable.     ASSESSMENT   1. Pain in both knees, unspecified chronicity   2. Chronic fatigue   3. Vitamin D deficiency     PLAN:  Pertinent additional documentation may be included in corresponding procedure notes, imaging studies, problem based documentation and patient instructions.  Procedures:  . None  Medications:  No orders of the defined types were placed in this encounter.  Discussion/Instructions: No problem-specific Assessment & Plan notes found for this encounter.  . Nonspecific fatigue and knee pain.  She has minimal  knee symptoms.  Continue increasing activities as tolerated we will recheck a vitamin D level as this has been associated with fatigue for her in the past. . Discussed red flag symptoms that warrant earlier emergent evaluation and patient voices understanding. . Activity modifications and  the importance of avoiding exacerbating activities (limiting pain to no more than a 4 / 10 during or following activity) recommended and discussed. . >50% of this 25 minutes minute visit spent in direct patient counseling and/or coordination of care. Discussion was focused on education regarding the in discussing the pathoetiology and anticipated clinical course of the above condition.  Follow-up:  . Return if symptoms worsen or fail to improve.   . If any lack of improvement consider: Further evaluation of lumbar spine and/or further diagnostic evaluation of her knees.  .      CMA/ATC served as Education administrator during this visit. History, Physical, and Plan performed by medical provider. Documentation and orders reviewed and attested to.      Gerda Diss, [facility] Physician

## 2018-06-29 ENCOUNTER — Encounter: Payer: Self-pay | Admitting: Sports Medicine

## 2018-07-03 DIAGNOSIS — H524 Presbyopia: Secondary | ICD-10-CM | POA: Diagnosis not present

## 2018-07-04 MED ORDER — CHOLECALCIFEROL 1.25 MG (50000 UT) PO TABS: 1.0000 | ORAL_TABLET | ORAL | 0 refills | Status: DC

## 2018-07-04 NOTE — Progress Notes (Signed)
Rx for Vit D 50,000 twice weekly has been sent in and Mychart Message sent

## 2018-08-13 DIAGNOSIS — F4310 Post-traumatic stress disorder, unspecified: Secondary | ICD-10-CM | POA: Diagnosis not present

## 2018-09-10 DIAGNOSIS — F4310 Post-traumatic stress disorder, unspecified: Secondary | ICD-10-CM | POA: Diagnosis not present

## 2018-09-17 DIAGNOSIS — F4310 Post-traumatic stress disorder, unspecified: Secondary | ICD-10-CM | POA: Diagnosis not present

## 2018-09-24 DIAGNOSIS — F4310 Post-traumatic stress disorder, unspecified: Secondary | ICD-10-CM | POA: Diagnosis not present

## 2018-10-08 DIAGNOSIS — F4310 Post-traumatic stress disorder, unspecified: Secondary | ICD-10-CM | POA: Diagnosis not present

## 2018-10-15 DIAGNOSIS — F4310 Post-traumatic stress disorder, unspecified: Secondary | ICD-10-CM | POA: Diagnosis not present

## 2018-10-22 DIAGNOSIS — F4310 Post-traumatic stress disorder, unspecified: Secondary | ICD-10-CM | POA: Diagnosis not present

## 2018-10-29 DIAGNOSIS — F4310 Post-traumatic stress disorder, unspecified: Secondary | ICD-10-CM | POA: Diagnosis not present

## 2018-11-05 DIAGNOSIS — F4310 Post-traumatic stress disorder, unspecified: Secondary | ICD-10-CM | POA: Diagnosis not present

## 2018-11-12 DIAGNOSIS — F4310 Post-traumatic stress disorder, unspecified: Secondary | ICD-10-CM | POA: Diagnosis not present

## 2019-01-28 DIAGNOSIS — M7671 Peroneal tendinitis, right leg: Secondary | ICD-10-CM | POA: Diagnosis not present

## 2019-03-13 ENCOUNTER — Ambulatory Visit: Payer: Self-pay

## 2019-03-13 NOTE — Telephone Encounter (Signed)
Noted  

## 2019-03-13 NOTE — Telephone Encounter (Signed)
See note. I spoke with Aldona Bar who agreed to seeing the patient in office on tomorrow morning. Patient does not have [place] and I did advise if symptoms get worse to go the ED immediately. Patient stated she understood and appreciated the call and appointment.

## 2019-03-13 NOTE — Telephone Encounter (Signed)
Incoming call from Patient  With a complaint of chest discomfort that started about 2weeks ago. Patient states that it is pretty much constant.  Has a normal steady pace . Reports a moderate discomfort.  Denies a cardiac history d Pulmonary Hx.  Thinks it may be acid reflux.  Has some dizziness some times.  Sweating occurs a little at night.  Patient reports that she is on her period now.  Awaits to her from office related to appointment.    Reason for Disposition . [1] Chest pain lasts > 5 minutes AND [2] occurred > 3 days ago (72 hours) AND [3] NO chest pain or cardiac symptoms now  Answer Assessment - Initial Assessment Questions 1. LOCATION: "Where does it hurt?"       Upper chest, center sometimes more to left 2. RADIATION: "Does the pain go anywhere else?" (e.g., into neck, jaw, arms, back)     shoulder 3. ONSET: "When did the chest pain begin?" (Minutes, hours or days)     2 weeks ago 4. PATTERN "Does the pain come and go, or has it been constant since it started?"  "Does it get worse with exertion?"     constant 5. DURATION: "How long does it last" (e.g., seconds, minutes, hours)      6. SEVERITY: "How bad is the pain?"  (e.g., Scale 1-10; mild, moderate, or severe)    - MILD (1-3): doesn't interfere with normal activities     - MODERATE (4-7): interferes with normal activities or awakens from sleep    - SEVERE (8-10): excruciating pain, unable to do any normal activities       moderate 7. CARDIAC RISK FACTORS: "Do you have any history of heart problems or risk factors for heart disease?" (e.g., prior heart attack, angina; high blood pressure, diabetes, being overweight, high cholesterol, smoking, or strong family history of heart disease)     Denies  8. PULMONARY RISK FACTORS: "Do you have any history of lung disease?"  (e.g., blood clots in lung, asthma, emphysema, birth control pills)     Not aware of other issues 9. CAUSE: "What do you think is causing the chest pain?"    Acid  reflux?10. OTHER SYMPTOMS: "Do you have any other symptoms?" (e.g., dizziness, nausea, vomiting, sweating, fever, difficulty breathing, cough)       Some time  Dizziness  A little sweating usally at nite  11. PREGNANCY: "Is there any chance you are pregnant?" "When was your last menstrual period?"      On period now  Protocols used: CHEST PAIN-A-AH

## 2019-03-14 ENCOUNTER — Encounter: Payer: Self-pay | Admitting: Physician Assistant

## 2019-03-14 ENCOUNTER — Ambulatory Visit: Payer: BLUE CROSS/BLUE SHIELD | Admitting: Physician Assistant

## 2019-03-14 ENCOUNTER — Other Ambulatory Visit: Payer: Self-pay

## 2019-03-14 VITALS — BP 121/79 | HR 69 | Temp 98.7°F | Ht 65.0 in | Wt 160.0 lb

## 2019-03-14 DIAGNOSIS — R079 Chest pain, unspecified: Secondary | ICD-10-CM

## 2019-03-14 LAB — CBC WITH DIFFERENTIAL/PLATELET
Basophils Absolute: 0 10*3/uL (ref 0.0–0.1)
Basophils Relative: 0.6 % (ref 0.0–3.0)
Eosinophils Absolute: 0 10*3/uL (ref 0.0–0.7)
Eosinophils Relative: 0.5 % (ref 0.0–5.0)
HCT: 39.1 % (ref 36.0–46.0)
Hemoglobin: 13.6 g/dL (ref 12.0–15.0)
Lymphocytes Relative: 23.3 % (ref 12.0–46.0)
Lymphs Abs: 1.4 10*3/uL (ref 0.7–4.0)
MCHC: 34.8 g/dL (ref 30.0–36.0)
MCV: 90.3 fl (ref 78.0–100.0)
Monocytes Absolute: 0.5 10*3/uL (ref 0.1–1.0)
Monocytes Relative: 8.4 % (ref 3.0–12.0)
Neutro Abs: 3.9 10*3/uL (ref 1.4–7.7)
Neutrophils Relative %: 67.2 % (ref 43.0–77.0)
Platelets: 230 10*3/uL (ref 150.0–400.0)
RBC: 4.33 Mil/uL (ref 3.87–5.11)
RDW: 13 % (ref 11.5–15.5)
WBC: 5.8 10*3/uL (ref 4.0–10.5)

## 2019-03-14 LAB — TSH: TSH: 0.82 u[IU]/mL (ref 0.35–4.50)

## 2019-03-14 LAB — COMPREHENSIVE METABOLIC PANEL
ALT: 16 U/L (ref 0–35)
AST: 15 U/L (ref 0–37)
Albumin: 4.3 g/dL (ref 3.5–5.2)
Alkaline Phosphatase: 40 U/L (ref 39–117)
BUN: 16 mg/dL (ref 6–23)
CO2: 28 mEq/L (ref 19–32)
Calcium: 9.1 mg/dL (ref 8.4–10.5)
Chloride: 104 mEq/L (ref 96–112)
Creatinine, Ser: 0.75 mg/dL (ref 0.40–1.20)
GFR: 80.53 mL/min (ref >60.00)
Glucose, Bld: 89 mg/dL (ref 70–99)
Potassium: 4.2 mEq/L (ref 3.5–5.1)
Sodium: 139 mEq/L (ref 135–145)
Total Bilirubin: 1.2 mg/dL (ref 0.2–1.2)
Total Protein: 7.1 g/dL (ref 6.0–8.3)

## 2019-03-14 LAB — LIPID PANEL
Cholesterol: 200 mg/dL (ref 0–200)
HDL: 65.4 mg/dL (ref >39.00)
LDL Cholesterol: 119 mg/dL — ABNORMAL HIGH (ref 0–99)
NonHDL: 134.73
Total CHOL/HDL Ratio: 3
Triglycerides: 81 mg/dL (ref 0.0–149.0)
VLDL: 16.2 mg/dL (ref 0.0–40.0)

## 2019-03-14 MED ORDER — ALBUTEROL SULFATE HFA 108 (90 BASE) MCG/ACT IN AERS: 2.0000 | INHALATION_SPRAY | Freq: Four times a day (QID) | RESPIRATORY_TRACT | 2 refills | Status: DC | PRN

## 2019-03-14 NOTE — Progress Notes (Signed)
Emily Meyer is a 53 y.o. female here for a new problem.  I acted as a Education administrator for [employer], PA-C Anselmo Pickler, LPN  History of Present Illness:   Chief Complaint  Patient presents with  . Chest Pain    Chest Pain   This is a new problem. Episode onset: Started 2 weeks ago. The onset quality is sudden. The problem occurs constantly (but can be distracted ). The problem has been gradually worsening. The pain is present in the substernal region (Left upper chest). The pain is at a severity of 3/10. The pain is mild. The quality of the pain is described as pressure and tightness. The pain radiates to the left shoulder. Associated symptoms include a cough, dizziness and nausea. Pertinent negatives include no fever, headaches, shortness of breath or vomiting. The cough is non-productive. The cough is relieved by humidity. Treatments tried: Aspirin. The treatment provided moderate relief.    Denies: fever, exposure to COVID-19, diarrhea  No exertional component to her chest pain. Did get "SOB" when sweeping stairs but also admits that she is much more sedentary with current pandemic.  She states that she is currently on a keto diet but "eats a lot of chocolate." Rarely drinks water. Suspects that she is quite dehydrated. Also endorses that she is under "a lot of stress" right now.  She reports that a few years ago she had a full cardiac work-up (dad died around age 38 due to aortic aneurysm) and everything was normal. I am unable to see those records.  Past Medical History:  Diagnosis Date  . IBS (irritable bowel syndrome)   . Right sided facial pain      Social History   Socioeconomic History  . Marital status: Divorced    Spouse name: Not on file  . Number of children: 2  . Years of education: 36  . Highest education level: Associate degree: occupational, Hotel manager, or vocational program  Occupational History  . Occupation: Therapist, art Rep    Employer: [employer]  . Financial resource strain: Not on file  . Food insecurity:    Worry: Not on file    Inability: Not on file  . Transportation needs:    Medical: Not on file    Non-medical: Not on file  Tobacco Use  . Smoking status: Never Smoker  . Smokeless tobacco: Never Used  Substance and Sexual Activity  . Alcohol use: No  . Drug use: No  . Sexual activity: Never  Lifestyle  . Physical activity:    Days per week: Not on file    Minutes per session: Not on file  . Stress: Not on file  Relationships  . Social connections:    Talks on phone: Not on file    Gets together: Not on file    Attends religious service: Not on file    Active member of club or organization: Not on file    Attends meetings of clubs or organizations: Not on file    Relationship status: Not on file  . Intimate partner violence:    Fear of current or ex partner: Not on file    Emotionally abused: Not on file    Physically abused: Not on file    Forced sexual activity: Not on file  Other Topics Concern  . Not on file  Social History Narrative   Lives at home with her husband.   Right-handed.   No caffeine use.  History reviewed. No pertinent surgical history.  Family History  Problem Relation Age of Onset  . Healthy Mother   . Hypertension Father   . AAA (abdominal aortic aneurysm) Father   . Diabetes Brother   . Breast cancer Paternal Aunt     Allergies  Allergen Reactions  . Adhesive [Tape]     rash    Current Medications:   Current Outpatient Medications:  .  Ascorbic Acid (VITAMIN C PO), Take by mouth daily., Disp: , Rfl:  .  b complex vitamins tablet, Take 1 tablet by mouth daily., Disp: , Rfl:  .  BETAINE PO, Take by mouth daily., Disp: , Rfl:  .  CALCIUM PO, Take by mouth daily., Disp: , Rfl:  .  Cholecalciferol 50000 units TABS, Take 1 tablet by mouth 2 (two) times a week., Disp: 24 tablet, Rfl: 0 .  COLLAGEN PO, Take by mouth daily., Disp: , Rfl:  .   Flaxseed, Linseed, (EQL FLAX SEED OIL) 1000 MG CAPS, Take 1,000 mg by mouth 2 (two) times daily., Disp: , Rfl:  .  Iodine, Kelp, (KELP PO), Take by mouth daily., Disp: , Rfl:  .  IRON PO, Take by mouth as needed., Disp: , Rfl:  .  Magnesium Oxide (MAG-CAPS PO), Take 1 capsule by mouth daily., Disp: , Rfl:  .  Methylsulfonylmethane (MSM PO), Take by mouth daily., Disp: , Rfl:  .  Multiple Vitamins-Minerals (ZINC PO), Take by mouth daily., Disp: , Rfl:  .  ST JOH[place] WORT PO, Take by mouth daily., Disp: , Rfl:  .  vitamin A 10000 UNIT capsule, Take 10,000 Units by mouth daily., Disp: , Rfl:  .  albuterol (VENTOLIN HFA) 108 (90 Base) MCG/ACT inhaler, Inhale 2 puffs into the lungs every 6 (six) hours as needed for wheezing or shortness of breath., Disp: 1 Inhaler, Rfl: 2   Review of Systems:   Review of Systems  Constitutional: Negative for fever.  Respiratory: Positive for cough. Negative for shortness of breath.   Cardiovascular: Positive for chest pain.  Gastrointestinal: Positive for nausea. Negative for vomiting.  Neurological: Positive for dizziness. Negative for headaches.    Vitals:   Vitals:   03/14/19 0838  BP: 121/79  Pulse: 69  Temp: 98.7 F (37.1 C)  TempSrc: Oral  SpO2: 99%  Weight: 160 lb (72.6 kg)  Height: 5\' 5"  (1.651 m)     Body mass index is 26.63 kg/m.  Physical Exam:   Physical Exam Vitals signs and nursing note reviewed.  Constitutional:      General: She is not in acute distress.    Appearance: She is well-developed. She is not ill-appearing or toxic-appearing.  Cardiovascular:     Rate and Rhythm: Normal rate and regular rhythm.     Pulses: Normal pulses.     Heart sounds: Normal heart sounds, S1 normal and S2 normal.     Comments: No LE edema Pulmonary:     Effort: Pulmonary effort is normal.     Breath sounds: Normal breath sounds.  Skin:    General: Skin is warm and dry.  Neurological:     Mental Status: She is alert.     GCS: GCS eye  subscore is 4. GCS verbal subscore is 5. GCS motor subscore is 6.  Psychiatric:        Speech: Speech normal.        Behavior: Behavior normal. Behavior is cooperative.     Assessment and Plan:   Emily Meyer was seen  today for chest pain.  Diagnoses and all orders for this visit:  Chest pain, unspecified type EKG tracing is personally reviewed.  EKG notes NSR.  No acute changes.  Discussed possible etiologies including muscle strain, GERD, anxiety, deconditioning, dehydration. I recommended pushing fluids -- with goal of 1.5 - 2 liters of day. Consider easing up on keto diet if "lightheadedness" persists. Did give her albuterol for her symptoms prn. Recommended nexium however she states that she doesn't like taking daily medications. Will check labs. Did discuss with patient that due to report of cough I cannot r/o COVID-19. Declines testing. Follow-up if symptoms persist. ER if symptoms worsen. -     EKG 12-Lead -     CBC with Differential/Platelet -     Comprehensive metabolic panel -     TSH -     Lipid panel  Other orders -     albuterol (VENTOLIN HFA) 108 (90 Base) MCG/ACT inhaler; Inhale 2 puffs into the lungs every 6 (six) hours as needed for wheezing or shortness of breath.  . Reviewed expectations re: course of current medical issues. . Discussed self-management of symptoms. . Outlined signs and symptoms indicating need for more acute intervention. . Patient verbalized understanding and all questions were answered. . See orders for this visit as documented in the electronic medical record. . Patient received an After-Visit Summary.  CMA or LPN served as scribe during this visit. History, Physical, and Plan performed by medical provider. The above documentation has been reviewed and is accurate and complete.  Inda Coke, PA-C

## 2019-03-14 NOTE — Patient Instructions (Signed)
It was great to see you!  We will call you with lab results.  Please work on drinking 1.5 - 2 liters of water daily.  Consider use of albuterol inhaler to help with symptoms.  Consider use of nexium (or store brand equivalent) for heartburn.  If you develop severe shortness of breath, uncontrolled fevers, coughing up blood, confusion, worsening or changes in chest pain, or signs of dehydration (such as significantly decreased urine amounts or dizziness with standing) please CALL the ER and then GO to the ER.  Push fluids and get plenty of rest.  Call clinic with questions.  I hope you start feeling better soon!

## 2019-10-18 HISTORY — PX: MOHS SURGERY: SUR867

## 2019-12-30 NOTE — Progress Notes (Signed)
I acted as a Education administrator for [employer], PA-C Anselmo Pickler, LPN   Subjective:    Emily Meyer is a 54 y.o. female and is here for a comprehensive physical exam.   HPI  Health Maintenance Due  Topic Date Due  . HIV Screening  Never done  . MAMMOGRAM  Never done  . COLONOSCOPY  Never done  . PAP SMEAR-Modifier  11/28/2017    Acute Concerns: Lesion -- red nevus on L forearm and R cheek for at least a few weeks, she does not wear sunscreen, has never had skin cancer.  She does not have a dermatologist. Burping -- patient has been experiencing random episodes of feeling off, and then when she burps she has complete resolution of symptoms.  She does have a history of IBS. She is following a keto diet.   Chronic Issues: Vitamin D deficiency -- would like an updated vitamin D level checked today  Health Maintenance: Immunizations -- declines Colonoscopy -- overdue Mammogram -- overdue PAP -- overdue Bone Density -- Never Diet -- tries to eat keto diet; feels like she eats too much chocolate or large portions, struggles with emotional eating Sleep habits -- 6-7 hours; occasionally waking up in the night  Exercise -- climbs the stairs at work Weight -- Weight: 169 lb 8 oz (76.9 kg)  Mood -- has situational stress with work; doesn't currently have a therapist Weight history: Wt Readings from Last 10 Encounters:  12/31/19 169 lb 8 oz (76.9 kg)  03/14/19 160 lb (72.6 kg)  06/22/18 167 lb 6.4 oz (75.9 kg)  10/05/17 167 lb 8 oz (76 kg)  03/17/17 157 lb 6.4 oz (71.4 kg)  03/07/17 161 lb 6.4 oz (73.2 kg)  Body mass index is 28.21 kg/m. Patient's last menstrual period was 12/14/2019. Period characteristics: getting shorter and heavier Alcohol use: none Tobacco use: none  Depression screen PHQ 2/9 12/31/2019  Decreased Interest 1  Down, Depressed, Hopeless 1  PHQ - 2 Score 2  Altered sleeping 0  Tired, decreased energy 3  Change in appetite 2  Feeling bad or failure about  yourself  3  Trouble concentrating 0  Moving slowly or fidgety/restless 0  Suicidal thoughts 0  PHQ-9 Score 10  Difficult doing work/chores Not difficult at all     Other providers/specialists: Patient Care Team: Inda Coke, [place] as PCP - General (Physician Assistant) Melissa Montane, MD as Consulting Physician (Otolaryngology)    PMHx, SurgHx, SocialHx, Medications, and Allergies were reviewed in the Visit Navigator and updated as appropriate.   Past Medical History:  Diagnosis Date  . IBS (irritable bowel syndrome)   . Right sided facial pain     History reviewed. No pertinent surgical history.   Family History  Problem Relation Age of Onset  . Healthy Mother   . Hypertension Father   . AAA (abdominal aortic aneurysm) Father   . Diabetes Brother   . Breast cancer Paternal Aunt   . Colon cancer Neg Hx     Social History   Tobacco Use  . Smoking status: Never Smoker  . Smokeless tobacco: Never Used  Substance Use Topics  . Alcohol use: No  . Drug use: No    Review of Systems:   Review of Systems  Constitutional: Negative for chills, fever, malaise/fatigue and weight loss.  HENT: Negative for hearing loss, sinus pain and sore throat.   Respiratory: Negative for cough and hemoptysis.   Cardiovascular: Negative for chest pain, palpitations, leg swelling and  PND.  Gastrointestinal: Negative for abdominal pain, constipation, diarrhea, heartburn, nausea and vomiting.  Genitourinary: Negative for dysuria, frequency and urgency.  Musculoskeletal: Negative for back pain, myalgias and neck pain.  Skin: Negative for itching and rash.  Neurological: Negative for dizziness, tingling, seizures and headaches.  Endo/Heme/Allergies: Negative for polydipsia.  Psychiatric/Behavioral: Negative for depression. The patient is not nervous/anxious.       Objective:   BP 110/72 (BP Location: Left Arm, Patient Position: Sitting, Cuff Size: Normal)   Pulse 73   Temp (!) 97.2  F (36.2 C) (Temporal)   Ht 5\' 5"  (1.651 m)   Wt 169 lb 8 oz (76.9 kg)   LMP 12/14/2019   SpO2 99%   BMI 28.21 kg/m  Body mass index is 28.21 kg/m.   General Appearance:    Alert, cooperative, no distress, appears stated age  Head:    Normocephalic, without obvious abnormality, atraumatic  Eyes:    PERRL, conjunctiva/corneas clear, EOM's intact, fundi    benign, both eyes  Ears:    Normal TM's and external ear canals, both ears  Nose:   Nares normal, septum midline, mucosa normal, no drainage    or sinus tenderness  Throat:   Lips, mucosa, and tongue normal; teeth and gums normal  Neck:   Supple, symmetrical, trachea midline, no adenopathy;    thyroid:  no enlargement/tenderness/nodules; no carotid   bruit or JVD  Back:     Symmetric, no curvature, ROM normal, no CVA tenderness  Lungs:     Clear to auscultation bilaterally, respirations unlabored  Chest Wall:    No tenderness or deformity   Heart:    Regular rate and rhythm, S1 and S2 normal, no murmur, rub or gallop  Breast Exam:    No tenderness, masses, or nipple abnormality  Abdomen:     Soft, non-tender, bowel sounds active all four quadrants,    no masses, no organomegaly  Genitalia:    Normal female without lesion, discharge or tenderness  Extremities:   Extremities normal, atraumatic, no cyanosis or edema  Pulses:   2+ and symmetric all extremities  Skin:   Skin color, texture, turgor normal, no rashes Small irregular raised erythematous lesion to left posterior forearm Approximately 2 mm round erythematous lesion under right eye  Lymph nodes:   Cervical, supraclavicular, and axillary nodes normal  Neurologic:   CNII-XII intact, normal strength, sensation and reflexes    throughout     Assessment/Plan:   Rinki was seen today for transfer of care and annual exam.  Diagnoses and all orders for this visit:  Encounter for general adult medical examination with abnormal findings Today patient counseled on age  appropriate routine health concerns for screening and prevention, each reviewed and up to date or declined. Immunizations reviewed and up to date or declined. Labs ordered and reviewed. Risk factors for depression reviewed and negative. Hearing function and visual acuity are intact. ADLs screened and addressed as needed. Functional ability and level of safety reviewed and appropriate. Education, counseling and referrals performed based on assessed risks today. Patient provided with a copy of personalized plan for preventive services.  Vitamin D deficiency Update labs today. -     VITAMIN D 25 Hydroxy (Vit-D Deficiency, Fractures)  Pap smear for cervical cancer screening -     Cytology - PAP  Encounter for lipid screening for cardiovascular disease -     Lipid panel  Overweight Encourage regular exercise and try to minimize comfort eating as much as  possible. -     CBC with Differential/Platelet -     Comprehensive metabolic panel  Special screening for malignant neoplasms, colon -     Cologuard  Burping Unclear etiology of symptoms.  I asked her to keep track of what she is eating around these episodes to see if that plays a role.  Consider GI referral for further evaluation if symptoms persist or worsen.  Skin lesion Will refer to dermatology for further evaluation and management. -     Ambulatory referral to Dermatology     Well Adult Exam: Labs ordered: Yes. Patient counseling was done. See below for items discussed. Discussed the patient's BMI.  The BMI is not in the acceptable range; BMI management plan is completed Follow up as needed for acute illness. Breast cancer screening: counseled. Cervical cancer screening: completed today   Patient Counseling: [x]    Nutrition: Stressed importance of moderation in sodium/caffeine intake, saturated fat and cholesterol, caloric balance, sufficient intake of fresh fruits, vegetables, fiber, calcium, iron, and 1 mg of folate supplement per  day (for females capable of pregnancy).  [x]    Stressed the importance of regular exercise.   [x]    Substance Abuse: Discussed cessation/primary prevention of tobacco, alcohol, or other drug use; driving or other dangerous activities under the influence; availability of treatment for abuse.   [x]    Injury prevention: Discussed safety belts, safety helmets, smoke detector, smoking near bedding or upholstery.   [x]    Sexuality: Discussed sexually transmitted diseases, partner selection, use of condoms, avoidance of unintended pregnancy  and contraceptive alternatives.  [x]    Dental health: Discussed importance of regular tooth brushing, flossing, and dental visits.  [x]    Health maintenance and immunizations reviewed. Please refer to Health maintenance section.   CMA or LPN served as scribe during this visit. History, Physical, and Plan performed by medical provider. The above documentation has been reviewed and is accurate and complete.  Inda Coke, PA-C [place]

## 2019-12-31 ENCOUNTER — Other Ambulatory Visit: Payer: Self-pay

## 2019-12-31 ENCOUNTER — Encounter: Payer: Self-pay | Admitting: Physician Assistant

## 2019-12-31 ENCOUNTER — Other Ambulatory Visit (HOSPITAL_COMMUNITY)
Admission: RE | Admit: 2019-12-31 | Discharge: 2019-12-31 | Disposition: A | Discharge status: Home / Self Care | Payer: BLUE CROSS/BLUE SHIELD | Source: Ambulatory Visit | Attending: Physician Assistant | Admitting: Physician Assistant

## 2019-12-31 ENCOUNTER — Ambulatory Visit (INDEPENDENT_AMBULATORY_CARE_PROVIDER_SITE_OTHER): Payer: BLUE CROSS/BLUE SHIELD | Admitting: Physician Assistant

## 2019-12-31 VITALS — BP 110/72 | HR 73 | Temp 97.2°F | Ht 65.0 in | Wt 169.5 lb

## 2019-12-31 DIAGNOSIS — Z1211 Encounter for screening for malignant neoplasm of colon: Secondary | ICD-10-CM | POA: Diagnosis not present

## 2019-12-31 DIAGNOSIS — Z0001 Encounter for general adult medical examination with abnormal findings: Secondary | ICD-10-CM

## 2019-12-31 DIAGNOSIS — E559 Vitamin D deficiency, unspecified: Secondary | ICD-10-CM

## 2019-12-31 DIAGNOSIS — Z1151 Encounter for screening for human papillomavirus (HPV): Secondary | ICD-10-CM | POA: Diagnosis not present

## 2019-12-31 DIAGNOSIS — Z136 Encounter for screening for cardiovascular disorders: Secondary | ICD-10-CM

## 2019-12-31 DIAGNOSIS — Z124 Encounter for screening for malignant neoplasm of cervix: Secondary | ICD-10-CM | POA: Diagnosis not present

## 2019-12-31 DIAGNOSIS — L989 Disorder of the skin and subcutaneous tissue, unspecified: Secondary | ICD-10-CM

## 2019-12-31 DIAGNOSIS — E663 Overweight: Secondary | ICD-10-CM

## 2019-12-31 DIAGNOSIS — Z13220 Encounter for screening for lipoid disorders: Secondary | ICD-10-CM

## 2019-12-31 DIAGNOSIS — R142 Eructation: Secondary | ICD-10-CM | POA: Diagnosis not present

## 2019-12-31 LAB — COMPREHENSIVE METABOLIC PANEL
ALT: 52 U/L — ABNORMAL HIGH (ref 0–35)
AST: 29 U/L (ref 0–37)
Albumin: 4.3 g/dL (ref 3.5–5.2)
Alkaline Phosphatase: 43 U/L (ref 39–117)
BUN: 11 mg/dL (ref 6–23)
CO2: 25 mEq/L (ref 19–32)
Calcium: 9.3 mg/dL (ref 8.4–10.5)
Chloride: 102 mEq/L (ref 96–112)
Creatinine, Ser: 0.7 mg/dL (ref 0.40–1.20)
GFR: 86.95 mL/min (ref >60.00)
Glucose, Bld: 90 mg/dL (ref 70–99)
Potassium: 4.1 mEq/L (ref 3.5–5.1)
Sodium: 137 mEq/L (ref 135–145)
Total Bilirubin: 1.1 mg/dL (ref 0.2–1.2)
Total Protein: 7 g/dL (ref 6.0–8.3)

## 2019-12-31 LAB — CBC WITH DIFFERENTIAL/PLATELET
Basophils Absolute: 0 10*3/uL (ref 0.0–0.1)
Basophils Relative: 0.5 % (ref 0.0–3.0)
Eosinophils Absolute: 0.1 10*3/uL (ref 0.0–0.7)
Eosinophils Relative: 1.1 % (ref 0.0–5.0)
HCT: 41.7 % (ref 36.0–46.0)
Hemoglobin: 14.1 g/dL (ref 12.0–15.0)
Lymphocytes Relative: 22.9 % (ref 12.0–46.0)
Lymphs Abs: 1.8 10*3/uL (ref 0.7–4.0)
MCHC: 33.7 g/dL (ref 30.0–36.0)
MCV: 92.1 fl (ref 78.0–100.0)
Monocytes Absolute: 0.7 10*3/uL (ref 0.1–1.0)
Monocytes Relative: 9.3 % (ref 3.0–12.0)
Neutro Abs: 5.3 10*3/uL (ref 1.4–7.7)
Neutrophils Relative %: 66.2 % (ref 43.0–77.0)
Platelets: 226 10*3/uL (ref 150.0–400.0)
RBC: 4.53 Mil/uL (ref 3.87–5.11)
RDW: 14.5 % (ref 11.5–15.5)
WBC: 8 10*3/uL (ref 4.0–10.5)

## 2019-12-31 LAB — LIPID PANEL
Cholesterol: 214 mg/dL — ABNORMAL HIGH (ref 0–200)
HDL: 79.3 mg/dL (ref >39.00)
LDL Cholesterol: 121 mg/dL — ABNORMAL HIGH (ref 0–99)
NonHDL: 134.38
Total CHOL/HDL Ratio: 3
Triglycerides: 67 mg/dL (ref 0.0–149.0)
VLDL: 13.4 mg/dL (ref 0.0–40.0)

## 2019-12-31 LAB — VITAMIN D 25 HYDROXY (VIT D DEFICIENCY, FRACTURES): VITD: 34.83 ng/mL (ref 30.00–100.00)

## 2019-12-31 NOTE — Patient Instructions (Signed)
It was great to see you!  Please go to the lab for blood work.   Our office will call you with your results unless you have chosen to receive results via MyChart.  You will be contacted about your referral to dermatology.  If your blood work is normal we will follow-up each year for physicals and as scheduled for chronic medical problems.  If anything is abnormal we will treat accordingly and get you in for a follow-up.  Take care,  [facility] Maintenance, Female Adopting a healthy lifestyle and getting preventive care are important in promoting health and wellness. Ask your health care provider about:  The right schedule for you to have regular tests and exams.  Things you can do on your own to prevent diseases and keep yourself healthy. What should I know about diet, weight, and exercise? Eat a healthy diet   Eat a diet that includes plenty of vegetables, fruits, low-fat dairy products, and lean protein.  Do not eat a lot of foods that are high in solid fats, added sugars, or sodium. Maintain a healthy weight Body mass index (BMI) is used to identify weight problems. It estimates body fat based on height and weight. Your health care provider can help determine your BMI and help you achieve or maintain a healthy weight. Get regular exercise Get regular exercise. This is one of the most important things you can do for your health. Most adults should:  Exercise for at least 150 minutes each week. The exercise should increase your heart rate and make you sweat (moderate-intensity exercise).  Do strengthening exercises at least twice a week. This is in addition to the moderate-intensity exercise.  Spend less time sitting. Even light physical activity can be beneficial. Watch cholesterol and blood lipids Have your blood tested for lipids and cholesterol at 55 years of age, then have this test every 5 years. Have your cholesterol levels checked more often if:  Your  lipid or cholesterol levels are high.  You are older than 54 years of age.  You are at high risk for heart disease. What should I know about cancer screening? Depending on your health history and family history, you may need to have cancer screening at various ages. This may include screening for:  Breast cancer.  Cervical cancer.  Colorectal cancer.  Skin cancer.  Lung cancer. What should I know about heart disease, diabetes, and high blood pressure? Blood pressure and heart disease  High blood pressure causes heart disease and increases the risk of stroke. This is more likely to develop in people who have high blood pressure readings, are of African descent, or are overweight.  Have your blood pressure checked: ? Every 3-5 years if you are 47-72 years of age. ? Every year if you are 75 years old or older. Diabetes Have regular diabetes screenings. This checks your fasting blood sugar level. Have the screening done:  Once every three years after age 73 if you are at a normal weight and have a low risk for diabetes.  More often and at a younger age if you are overweight or have a high risk for diabetes. What should I know about preventing infection? Hepatitis B If you have a higher risk for hepatitis B, you should be screened for this virus. Talk with your health care provider to find out if you are at risk for hepatitis B infection. Hepatitis C Testing is recommended for:  Everyone born from [birth year] through 1966.  Anyone with known risk factors for hepatitis C. Sexually transmitted infections (STIs)  Get screened for STIs, including gonorrhea and chlamydia, if: ? You are sexually active and are younger than 55 years of age. ? You are older than 55 years of age and your health care provider tells you that you are at risk for this type of infection. ? Your sexual activity has changed since you were last screened, and you are at increased risk for chlamydia or gonorrhea. Ask  your health care provider if you are at risk.  Ask your health care provider about whether you are at high risk for HIV. Your health care provider may recommend a prescription medicine to help prevent HIV infection. If you choose to take medicine to prevent HIV, you should first get tested for HIV. You should then be tested every 3 months for as long as you are taking the medicine. Pregnancy  If you are about to stop having your period (premenopausal) and you may become pregnant, seek counseling before you get pregnant.  Take 400 to 800 micrograms (mcg) of folic acid every day if you become pregnant.  Ask for birth control (contraception) if you want to prevent pregnancy. Osteoporosis and menopause Osteoporosis is a disease in which the bones lose minerals and strength with aging. This can result in bone fractures. If you are 38 years old or older, or if you are at risk for osteoporosis and fractures, ask your health care provider if you should:  Be screened for bone loss.  Take a calcium or vitamin D supplement to lower your risk of fractures.  Be given hormone replacement therapy (HRT) to treat symptoms of menopause. Follow these instructions at home: Lifestyle  Do not use any products that contain nicotine or tobacco, such as cigarettes, e-cigarettes, and chewing tobacco. If you need help quitting, ask your health care provider.  Do not use street drugs.  Do not share needles.  Ask your health care provider for help if you need support or information about quitting drugs. Alcohol use  Do not drink alcohol if: ? Your health care provider tells you not to drink. ? You are pregnant, may be pregnant, or are planning to become pregnant.  If you drink alcohol: ? Limit how much you use to 0-1 drink a day. ? Limit intake if you are breastfeeding.  Be aware of how much alcohol is in your drink. In the [place] one drink equals one 12 oz bottle of beer (355 mL), one 5 oz glass of wine  (148 mL), or one 1 oz glass of hard liquor (44 mL). General instructions  Schedule regular health, dental, and eye exams.  Stay current with your vaccines.  Tell your health care provider if: ? You often feel depressed. ? You have ever been abused or do not feel safe at home. Summary  Adopting a healthy lifestyle and getting preventive care are important in promoting health and wellness.  Follow your health care provider's instructions about healthy diet, exercising, and getting tested or screened for diseases.  Follow your health care provider's instructions on monitoring your cholesterol and blood pressure. This information is not intended to replace advice given to you by your health care provider. Make sure you discuss any questions you have with your health care provider. Document Revised: 09/26/2018 Document Reviewed: 09/26/2018 Elsevier Patient Education  2020 [employer].

## 2020-01-01 LAB — CYTOLOGY - PAP
Comment: NEGATIVE
Diagnosis: NEGATIVE
High risk HPV: NEGATIVE

## 2020-01-06 ENCOUNTER — Other Ambulatory Visit: Payer: Self-pay | Admitting: Physician Assistant

## 2020-01-06 DIAGNOSIS — R7989 Other specified abnormal findings of blood chemistry: Secondary | ICD-10-CM

## 2020-01-14 DIAGNOSIS — S86391D Other injury of muscle(s) and tendon(s) of peroneal muscle group at lower leg level, right leg, subsequent encounter: Secondary | ICD-10-CM | POA: Diagnosis not present

## 2020-01-14 DIAGNOSIS — M25672 Stiffness of left ankle, not elsewhere classified: Secondary | ICD-10-CM | POA: Diagnosis not present

## 2020-01-15 ENCOUNTER — Other Ambulatory Visit (INDEPENDENT_AMBULATORY_CARE_PROVIDER_SITE_OTHER): Payer: BLUE CROSS/BLUE SHIELD

## 2020-01-15 ENCOUNTER — Other Ambulatory Visit: Payer: Self-pay

## 2020-01-15 DIAGNOSIS — R7989 Other specified abnormal findings of blood chemistry: Secondary | ICD-10-CM | POA: Diagnosis not present

## 2020-01-15 LAB — COMPREHENSIVE METABOLIC PANEL
ALT: 24 U/L (ref 0–35)
AST: 17 U/L (ref 0–37)
Albumin: 4.2 g/dL (ref 3.5–5.2)
Alkaline Phosphatase: 43 U/L (ref 39–117)
BUN: 14 mg/dL (ref 6–23)
CO2: 26 mEq/L (ref 19–32)
Calcium: 9.1 mg/dL (ref 8.4–10.5)
Chloride: 105 mEq/L (ref 96–112)
Creatinine, Ser: 0.72 mg/dL (ref 0.40–1.20)
GFR: 84.15 mL/min (ref >60.00)
Glucose, Bld: 96 mg/dL (ref 70–99)
Potassium: 4.3 mEq/L (ref 3.5–5.1)
Sodium: 138 mEq/L (ref 135–145)
Total Bilirubin: 1.2 mg/dL (ref 0.2–1.2)
Total Protein: 6.4 g/dL (ref 6.0–8.3)

## 2020-01-21 DIAGNOSIS — Z1211 Encounter for screening for malignant neoplasm of colon: Secondary | ICD-10-CM | POA: Diagnosis not present

## 2020-01-21 LAB — COLOGUARD: Cologuard: NEGATIVE

## 2020-01-27 ENCOUNTER — Encounter: Payer: Self-pay | Admitting: Physician Assistant

## 2020-01-27 ENCOUNTER — Telehealth: Payer: Self-pay | Admitting: *Deleted

## 2020-01-27 NOTE — Telephone Encounter (Signed)
Left message on voicemail to call office. Received pt results from [place] her Cologuard was Negative.

## 2020-01-27 NOTE — Telephone Encounter (Signed)
Pt called back told her Cologuard was Negative. Pt verbalized understanding.

## 2020-03-03 DIAGNOSIS — S0502XA Injury of conjunctiva and corneal abrasion without foreign body, left eye, initial encounter: Secondary | ICD-10-CM | POA: Diagnosis not present

## 2020-03-09 ENCOUNTER — Other Ambulatory Visit: Payer: Self-pay

## 2020-03-09 ENCOUNTER — Encounter: Payer: Self-pay | Admitting: Dermatology

## 2020-03-09 ENCOUNTER — Ambulatory Visit: Payer: BLUE CROSS/BLUE SHIELD | Admitting: Dermatology

## 2020-03-09 DIAGNOSIS — D485 Neoplasm of uncertain behavior of skin: Secondary | ICD-10-CM | POA: Diagnosis not present

## 2020-03-09 DIAGNOSIS — L821 Other seborrheic keratosis: Secondary | ICD-10-CM | POA: Diagnosis not present

## 2020-03-09 DIAGNOSIS — Z1283 Encounter for screening for malignant neoplasm of skin: Secondary | ICD-10-CM

## 2020-03-09 DIAGNOSIS — D229 Melanocytic nevi, unspecified: Secondary | ICD-10-CM

## 2020-03-09 DIAGNOSIS — D225 Melanocytic nevi of trunk: Secondary | ICD-10-CM | POA: Diagnosis not present

## 2020-03-09 DIAGNOSIS — L720 Epidermal cyst: Secondary | ICD-10-CM | POA: Diagnosis not present

## 2020-03-09 DIAGNOSIS — C44319 Basal cell carcinoma of skin of other parts of face: Secondary | ICD-10-CM | POA: Diagnosis not present

## 2020-03-09 DIAGNOSIS — L570 Actinic keratosis: Secondary | ICD-10-CM | POA: Diagnosis not present

## 2020-03-09 DIAGNOSIS — L820 Inflamed seborrheic keratosis: Secondary | ICD-10-CM | POA: Diagnosis not present

## 2020-03-09 DIAGNOSIS — C4491 Basal cell carcinoma of skin, unspecified: Secondary | ICD-10-CM

## 2020-03-09 HISTORY — DX: Basal cell carcinoma of skin, unspecified: C44.91

## 2020-03-09 NOTE — Patient Instructions (Addendum)
Biopsy, Surgery (Curettage) & Surgery (Excision) Aftercare Instructions  1. Okay to remove bandage in 24 hours  2. Wash area with soap and water  3. Apply Vaseline to area twice daily until healed (Not Neosporin)  4. Okay to cover with a Band-Aid to decrease the chance of infection or prevent irritation from clothing; also it's okay to uncover lesion at home.  5. Suture instructions: return to our office in 7-10 or 10-14 days for a nurse visit for suture removal. Variable healing with sutures, if pain or itching occurs call our office. It's okay to shower or bathe 24 hours after sutures are given.  6. The following risks may occur after a biopsy, curettage or excision: bleeding, scarring, discoloration, recurrence, infection (redness, yellow drainage, pain or swelling).  7. For questions, concerns and results call our office at [place] before 4pm & Friday before 3pm. Biopsy results will be available in 1 week.  KERA  Rosacea Rosacea is a long-term (chronic) condition that affects the skin of the face, including the cheeks, nose, forehead, and chin. This condition can also affect the eyes. Rosacea causes blood vessels near the surface of the skin to get bigger (be enlarged), and that makes the skin red. What are the causes? The cause of this condition is not known. Certain things can make rosacea worse, including:  Hot baths.  Exercise.  Sunlight.  Very hot or cold temperatures.  Hot or spicy foods and drinks.  Drinking alcohol.  Stress.  Taking blood pressure medicine.  Long-term use of topical steroids on the face. What increases the risk? You are more likely to get this condition if you:  Are older than 55 years of age.  Are a woman.  Have light-colored skin (light complexion).  Have a family history of the condition. What are the signs or symptoms?   Redness of the face.  Red bumps or pimples on the face.  A red, enlarged nose.  Blushing  easily.  Red lines on the skin.  Irritated, burning, or itchy feeling in the eyes.  Swollen eyelids.  Drainage from the eyes.  Feeling like there is something in your eye. How is this treated? There is no cure for this condition, but treatment can help to control your symptoms. Your doctor may suggest that you see a skin specialist (dermatologist). Treatment may include:  Medicines that are put on the skin or taken by mouth (orally).  Laser treatment to improve how the skin looks.  Surgery. This is rare. Your doctor will also suggest the best way to take care of your skin. Even after your skin gets better, you will likely need to continue treatment to keep your rosacea from coming back. Follow these instructions at home: Skin care Take care of your skin as told by your doctor. Your doctor may tell you to do these things:  Wash your skin gently two or more times each day.  Use mild soap.  Use a sunscreen or sunblock with SPF 30 or greater.  Use gentle cosmetics that are meant for sensitive skin.  Shave with an electric shaver instead of a blade. Lifestyle  Try to keep track of what foods make this condition worse. Avoid those foods. These may include: ? Spicy foods. ? Seafood. ? Cheese. ? Hot liquids. ? Nuts. ? Chocolate. ? Iodized salt.  Do not drink alcohol.  Avoid very cold or hot temperatures.  Try to reduce your stress. If you need help to do this, talk with your doctor.  When you exercise, do these things to stay cool: ? Limit sun exposure to your face. ? Use a fan. ? Exercise for a shorter time, and exercise more often. General instructions  Take and apply over-the-counter and prescription medicines only as told by your doctor.  If you were prescribed an antibiotic medicine, apply it or take it as told by your doctor. Do not stop using the antibiotic even if your condition improves.  If your eyelids are affected, hold warm compresses on them. Do this  as told by your doctor.  Keep all follow-up visits as told by your doctor. This is important. Contact a doctor if:  Your symptoms get worse.  Your symptoms do not improve after 2 months of treatment.  You have new symptoms.  You have any changes in how you see (vision) or you have problems with your eyes, such as redness or itching.  You feel very sad (depressed).  You do not want to eat as much as normal (lose your appetite).  You have trouble focusing your mind (concentrating). Summary  Rosacea is a long-term condition that affects the skin of the face, including the cheeks, nose, forehead, and chin.  Take care of your skin as told by your doctor.  Take and apply medicines only as told by your doctor.  Contact a doctor if your symptoms get worse or if you have problems with your eyes. This information is not intended to replace advice given to you by your health care provider. Make sure you discuss any questions you have with your health care provider. Document Revised: 03/07/2018 Document Reviewed: 03/07/2018 Elsevier Patient Education  [place].  Actinic Keratosis An actinic keratosis is a precancerous growth on the skin. If there is more than one growth, the condition is called actinic keratoses. Actinic keratoses appear most often on areas of skin that get a lot of sun exposure, including the scalp, face, ears, lips, upper back, forearms, and the backs of the hands. If left untreated, these growths may develop into a skin cancer called squamous cell carcinoma. It is important to have all these growths checked by a health care provider to determine the best treatment approach. What are the causes? Actinic keratoses are caused by getting too much ultraviolet (UV) radiation from the sun or other UV light sources. What increases the risk? You are more likely to develop this condition if you:  Have light-colored skin and blue eyes.  Have blond or red hair.  Spend  a lot of time in the sun.  Do not protect your skin from the sun when outdoors.  Are an older person. The risk of developing an actinic keratosis increases with age. What are the signs or symptoms? Actinic keratoses feel like scaly, rough spots of skin. Symptoms of this condition include growths that may:  Be as small as a pinhead or as big as a quarter.  Itch, hurt, or feel sensitive.  Be skin-colored, light tan, dark tan, pink, or a combination of any of these colors. In most cases, the growths become red.  Have a small piece of pink or gray skin (skin tag) growing from them. It may be easier to notice actinic keratoses by feeling them, rather than seeing them. Sometimes, actinic keratoses disappear, but many reappear a few days to a few weeks later. How is this diagnosed? This condition is usually diagnosed with a physical exam.  A tissue sample may be removed from the actinic keratosis and examined under a  microscope (biopsy). How is this treated? If needed, this condition may be treated by:  Scraping off the actinic keratosis (curettage).  Freezing the actinic keratosis with liquid nitrogen (cryosurgery). This causes the growth to eventually fall off the skin.  Applying medicated creams or gels to destroy the cells in the growth.  Applying chemicals to the actinic keratosis to make the outer layers of skin peel off (chemical peel).  Using photodynamic therapy. In this procedure, medicated cream is applied to the actinic keratosis. This cream increases your skin's sensitivity to light. Then, a strong light is aimed at the actinic keratosis to destroy cells in the growth. Follow these instructions at home: Skin care  Apply cool, wet cloths (cool compresses) to the affected areas.  Do not scratch your skin.  Check your skin regularly for any growths, especially growths that: ? Start to itch or bleed. ? Change in size, shape, or color. Caring for the treated area  Keep  the treated area clean and dry as told by your health care provider.  Do not apply any medicine, cream, or lotion to the treated area unless your health care provider tells you to do that.  Do not pick at blisters or try to break them open. This can cause infection and scarring.  If you have red or irritated skin after treatment, follow instructions from your health care provider about how to take care of the treated area. Make sure you: ? Wash your hands with soap and water before you change your bandage (dressing). If soap and water are not available, use hand sanitizer. ? Change your dressing as told by your health care provider.  If you have red or irritated skin after treatment, check your treated area every day for signs of infection. Check for: ? Redness, swelling, or pain. ? Fluid or blood. ? Warmth. ? Pus or a bad smell. General instructions  Take or apply over-the-counter and prescription medicines only as told by your health care provider.  Return to your normal activities as told by your health care provider. Ask your health care provider what activities are safe for you.  Have a skin exam done every year by a health care provider who is a skin specialist (dermatologist).  Keep all follow-up visits as told by your health care provider. This is important. Lifestyle  Do not use any products that contain nicotine or tobacco, such as cigarettes and e-cigarettes. If you need help quitting, ask your health care provider.  Take steps to protect your skin from the sun. ? Try to avoid the sun between 10:00 a.m. and 4:00 p.m. This is when the UV light is the strongest. ? Use a sunscreen or sunblock with SPF 30 (sun protection factor 30) or greater. ? Apply sunscreen before you are exposed to sunlight and reapply as often as directed by the instructions on the sunscreen container. ? Always wear sunglasses that have UV protection, and always wear a hat and clothing to protect your  skin from sunlight. ? When possible, avoid medicines that increase your sensitivity to sunlight. ? Do not use tanning beds or other indoor tanning devices. Contact a health care provider if:  You notice any changes or new growths on your skin.  You have swelling, pain, or more redness around your treated area.  You have fluid or blood coming from your treated area.  Your treated area feels warm to the touch.  You have pus or a bad smell coming from your treated area.  You have a fever.  You have a blister that becomes large and painful. Summary  An actinic keratosis is a precancerous growth on the skin. If there is more than one growth, the condition is called actinic keratoses. In some cases, if left untreated, these growths can develop into skin cancer.  Check your skin regularly for any growths, especially growths that start to itch or bleed, or change in size, shape, or color.  Take steps to protect your skin from the sun.  Contact a health care provider if you notice any changes or new growths on your skin.  Keep all follow-up visits as told by your health care provider. This is important. This information is not intended to replace advice given to you by your health care provider. Make sure you discuss any questions you have with your health care provider. Document Revised: 02/13/2018 Document Reviewed: 02/13/2018 Elsevier Patient Education  [place].

## 2020-03-09 NOTE — Progress Notes (Signed)
New Patient   Subjective  Emily Meyer is a 54 y.o. female who presents for the following: Annual Exam (pink spot left forearm x months). New growths Location: Left back shoulder and under right eye Duration: 1 year Quality: Crease size and shoulder itches Associated Signs/Symptoms: Modifying Factors:  Severity:  Timing: Context:    The following portions of the chart were reviewed this encounter and updated as appropriate: Tobacco  Allergies  Meds  Problems  Med Hx  Surg Hx  Fam Hx      Objective  Well appearing patient in no apparent distress; mood and affect are within normal limits.  A full examination was performed including scalp, head, eyes, ears, nose, lips, neck, chest, axillae, abdomen, back, buttocks, bilateral upper extremities, bilateral lower extremities, hands, feet, fingers, toes, fingernails, and toenails. All findings within normal limits unless otherwise noted below.   Assessment & Plan  AK (actinic keratosis) Left Forearm - Posterior  Destruction of lesion - Left Forearm - Posterior Complexity: simple   Destruction method: cryotherapy   Informed consent: discussed and consent obtained   Timeout:  patient name, date of birth, surgical site, and procedure verified Lesion destroyed using liquid nitrogen: Yes   Region frozen until ice ball extended beyond lesion: Yes   Cryotherapy cycles:  5 Outcome: patient tolerated procedure well with no complications    Milia (2) Right Temple; Left Zygomatic Area  Neoplasm of uncertain behavior of skin (2) Right Malar Cheek  Skin / nail biopsy Type of biopsy: tangential   Informed consent: discussed and consent obtained   Timeout: patient name, date of birth, surgical site, and procedure verified   Procedure prep:  Patient was prepped and draped in usual sterile fashion Prep type:  Chlorhexidine Anesthesia: the lesion was anesthetized in a standard fashion   Anesthetic:  1% lidocaine w/ epinephrine  1-100,000 local infiltration Instrument used: flexible razor blade   Hemostasis achieved with: ferric subsulfate   Outcome: patient tolerated procedure well   Post-procedure details: wound care instructions given    Specimen 1 - Surgical pathology Differential Diagnosis: BCC SCC Check Margins: No  Left Upper Back  Skin / nail biopsy Type of biopsy: tangential   Informed consent: discussed and consent obtained   Timeout: patient name, date of birth, surgical site, and procedure verified   Procedure prep:  Patient was prepped and draped in usual sterile fashion Prep type:  Chlorhexidine Anesthesia: the lesion was anesthetized in a standard fashion   Anesthetic:  1% lidocaine w/ epinephrine 1-100,000 local infiltration Instrument used: flexible razor blade   Hemostasis achieved with: ferric subsulfate   Outcome: patient tolerated procedure well   Post-procedure details: wound care instructions given    Specimen 2 - Surgical pathology Differential Diagnosis: R/O CN Check Margins: No  Nevus Mid Back  Seborrheic keratosis (5) Left Shoulder - Anterior; Left Upper Arm - Posterior; Right Upper Arm - Posterior; Left Thigh - Posterior; Right Lower Leg - Posterior  Reassure First visit for Emily Meyer, originally from [place].  She notes 2 new growths below the right eye and on the back of the left shoulder.  The right eye is a pearly 3 mm bump which is a possible basal cell skin cancer; that on the back of the left shoulder is a flesh-colored crusts.  Each of these had a shave biopsy done and Emily Meyer can check on MyChart in 2 days or call my office to discuss the results.  Additionally there has been a  little pink crust on the left forearm which has been present for months; this fits a solar keratosis (precancer) and was treated with 5 seconds of liquid nitrogen.  Mole check from the bottoms of the feet to the face showed no atypical moles or melanoma.  On upper arms and the backs  of legs are 2 dozen flat tan textured stucco keratoses which require no intervention.  On the front of the left shoulder is a noninflamed seborrheic keratosis which likewise is intrinsically benign.  On the top of each foot are calluses related to irritation from shoes; although she may try an over-the-counter moleskin or callus pad or callus removal, these are prone to be recurrent.  Outside the right lower right more than the left are multiple 1 mm white dots called milia.  Tiny inflammatory central facial bumps can be either endogenous mild rosacea or related to the Covid mask; we discussed prescription medications but for now Emily Meyer was comfortable with nonintervention.  We also spent time discussing the risks and benefits of different Covid vaccines Emily Meyer promised to think this over.  She will have her 52 year old daughter look at her back twice annually and examine her own skin as well.

## 2020-03-10 ENCOUNTER — Encounter: Payer: Self-pay | Admitting: Dermatology

## 2020-03-11 ENCOUNTER — Telehealth: Payer: Self-pay

## 2020-03-11 NOTE — Telephone Encounter (Signed)
Phone call to patient with her pathology results and Dr. Tafeen's recommendations. Patient aware of results and Dr. Tafeen's recommendations.  

## 2020-03-11 NOTE — Telephone Encounter (Signed)
-----   Message from Emily Monarch, MD sent at 03/10/2020  8:15 PM EDT ----- Inform pt because of her (young) age and lesion location I want her to see Dr. Link Snuffer

## 2020-03-19 DIAGNOSIS — Z1231 Encounter for screening mammogram for malignant neoplasm of breast: Secondary | ICD-10-CM | POA: Diagnosis not present

## 2020-03-19 LAB — HM MAMMOGRAPHY

## 2020-03-27 ENCOUNTER — Encounter: Payer: Self-pay | Admitting: Physician Assistant

## 2020-03-31 ENCOUNTER — Telehealth: Payer: Self-pay | Admitting: Dermatology

## 2020-03-31 NOTE — Telephone Encounter (Signed)
Said felt burning sensation where biopsy was done

## 2020-04-02 DIAGNOSIS — M1611 Unilateral primary osteoarthritis, right hip: Secondary | ICD-10-CM | POA: Diagnosis not present

## 2020-04-02 NOTE — Telephone Encounter (Signed)
Patient stated she was putting lotion on the lesion, and my advice was to discontinue the lotion and apply Vaseline and up date [place] if any other concerns. Patient also aware the skin surgery center may can move up her appointment is she call them with discomfort concerns.

## 2020-04-14 ENCOUNTER — Telehealth: Payer: Self-pay

## 2020-04-14 NOTE — Telephone Encounter (Signed)
Patient needs an OV scheduled before 04/21/20 so she can have medical clearance form completed. Thanks.

## 2020-04-15 ENCOUNTER — Encounter: Payer: Self-pay | Admitting: Physician Assistant

## 2020-04-15 ENCOUNTER — Ambulatory Visit (INDEPENDENT_AMBULATORY_CARE_PROVIDER_SITE_OTHER): Payer: BLUE CROSS/BLUE SHIELD | Admitting: Physician Assistant

## 2020-04-15 ENCOUNTER — Other Ambulatory Visit: Payer: Self-pay

## 2020-04-15 VITALS — BP 118/68 | HR 81 | Temp 97.6°F | Ht 65.0 in | Wt 173.0 lb

## 2020-04-15 DIAGNOSIS — Z01818 Encounter for other preprocedural examination: Secondary | ICD-10-CM | POA: Diagnosis not present

## 2020-04-15 NOTE — Telephone Encounter (Signed)
Patient is scheduled for today at 2:30.

## 2020-04-15 NOTE — Patient Instructions (Signed)
It was great to see you!  We will complete your paperwork as soon as your labs have returned. Good luck with your surgery!  Let's follow-up at your convenience to discuss your diet, sooner if you have concerns.  Take care,  Inda Coke PA-C

## 2020-04-15 NOTE — Progress Notes (Signed)
Emily Meyer is a 54 y.o. female is here for surgical clearance.  I acted as a Education administrator for [employer], Emily Meyer, Emily Meyer   History of Present Illness:   Chief Complaint  Patient presents with  . Surgical clearance    HPI   Surgical clearance Pt is here today for clearance for surgery. She is planning to undergo a R anterior hip arthroplasty under spinal anesthesia, with Dr. Frederik Meyer, on 05/01/20.  Patient denies: angina, dyspnea, syncope, and palpitations. She currently has no known history of heart disease.  She currently has no known history of hypertension, diabetes, chronic kidney disease, and cerebrovascular or peripheral artery disease.   She is able to perform regular activities that increase her heart rate (brisk walking, carrying groceries, etc.) without any chest pain, SOB or other symptoms.  Health Maintenance Due  Topic Date Due  . Hepatitis C Screening  Never done  . COVID-19 Vaccine (1) Never done  . HIV Screening  Never done    Past Medical History:  Diagnosis Date  . IBS (irritable bowel syndrome)   . 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 Right sided facial pain      Social History   Tobacco Use  . Smoking status: Never Smoker  . Smokeless tobacco: Never Used  Vaping Use  . Vaping Use: Never used  Substance Use Topics  . Alcohol use: No  . Drug use: No    History reviewed. No pertinent surgical history.  Family History  Problem Relation Age of Onset  . Healthy Mother   . Hypertension Father   . AAA (abdominal aortic aneurysm) Father   . Diabetes Brother   . Breast cancer Paternal Aunt   . Colon cancer Neg Hx     PMHx, SurgHx, SocialHx, FamHx, Medications, and Allergies were reviewed in the Visit Navigator and updated as appropriate.   Patient Active Problem List   Diagnosis Date Noted  . Vitamin D deficiency 06/22/2018  . Chronic pansinusitis 06/15/2017  . Pharyngoesophageal dysphagia  06/15/2017  . 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 Smoking status: Never Smoker  . Smokeless tobacco: Never Used  Vaping Use  . Vaping Use: Never used  Substance Use Topics  . Alcohol use: No  . Drug use: No    Current Medications and Allergies:    Current Outpatient Medications:  .  Ascorbic Acid (VITAMIN C PO), Take by mouth daily., Disp: , Rfl:  .  b complex vitamins tablet, Take 1 tablet by mouth daily., Disp: , Rfl:  .  BETAINE PO, Take by mouth daily., Disp: , Rfl:  .  CALCIUM PO, Take by mouth daily., Disp: , Rfl:  .  COLLAGEN PO, Take by mouth daily., Disp: , Rfl:  .  Flaxseed, Linseed, (EQL FLAX SEED OIL) 1000 MG CAPS, Take 1,000 mg by mouth 2 (two) times daily., Disp: , Rfl:  .  Iodine, Kelp, (KELP PO), Take by mouth daily., Disp: , Rfl:  .  IRON PO, Take by mouth as needed., Disp: , Rfl:  .  Magnesium Oxide (MAG-CAPS PO), Take 1 capsule by mouth daily., Disp: , Rfl:  .  Zinc Sulfate (ZINC 15 PO), Take 1 tablet by mouth daily., Disp: , Rfl:    Allergies  Allergen Reactions  . Adhesive [Tape]     rash    Review of Systems   ROS  Negative unless otherwise specified per HPI.  Vitals:   Vitals:   04/15/20 1440  BP: 118/68  Pulse: 81  Temp: 97.6 F (36.4 C)  TempSrc: Temporal  SpO2: 96%  Weight: 173 lb (78.5 kg)  Height: 5\' 5"  (1.651 m)     Body mass index is 28.79 kg/m.   Physical Exam:    Physical Exam Vitals and nursing note reviewed.  Constitutional:      General: She is not in acute distress.    Appearance: She is well-developed. She is not ill-appearing or toxic-appearing.  Cardiovascular:     Rate and Rhythm: Normal rate and regular rhythm.     Pulses: Normal pulses.     Heart sounds: Normal heart sounds, S1 normal and S2 normal.     Comments: No LE edema Pulmonary:     Effort: Pulmonary effort is normal.     Breath sounds: Normal breath sounds.  Skin:    General: Skin is warm and dry.  Neurological:      Mental Status: She is alert.     GCS: GCS eye subscore is 4. GCS verbal subscore is 5. GCS motor subscore is 6.  Psychiatric:        Speech: Speech normal.        Behavior: Behavior normal. Behavior is cooperative.      Assessment and Plan:    Emily Meyer was seen today for surgical clearance.  Diagnoses and all orders for this visit:  Pre-operative clearance -     CBC with Differential/Platelet -     Comprehensive metabolic panel -     Hemoglobin A1c   She is an overall low-risk candidate for surgery. Denies any cardiac concerns.  Will update labs per request from orthopedist and complete form as appropriate.  . Reviewed expectations re: course of current medical issues. . Discussed self-management of symptoms. . Outlined signs and symptoms indicating need for more acute intervention. . Patient verbalized understanding and all questions were answered. . See orders for this visit as documented in the electronic medical record. . Patient received an After Visit Summary.  CMA or LPN served as scribe during this visit. History, Physical, and Plan performed by medical provider. The above documentation has been reviewed and is accurate and complete.   Emily Coke, PA-C [place] [place] 04/15/2020  Follow-up: No follow-ups on file.

## 2020-04-16 LAB — CBC WITH DIFFERENTIAL/PLATELET
Basophils Absolute: 0.1 10*3/uL (ref 0.0–0.1)
Basophils Relative: 0.9 % (ref 0.0–3.0)
Eosinophils Absolute: 0.1 10*3/uL (ref 0.0–0.7)
Eosinophils Relative: 1.3 % (ref 0.0–5.0)
HCT: 37 % (ref 36.0–46.0)
Hemoglobin: 12.6 g/dL (ref 12.0–15.0)
Lymphocytes Relative: 29.5 % (ref 12.0–46.0)
Lymphs Abs: 1.8 10*3/uL (ref 0.7–4.0)
MCHC: 33.9 g/dL (ref 30.0–36.0)
MCV: 90.9 fl (ref 78.0–100.0)
Monocytes Absolute: 0.5 10*3/uL (ref 0.1–1.0)
Monocytes Relative: 8.3 % (ref 3.0–12.0)
Neutro Abs: 3.7 10*3/uL (ref 1.4–7.7)
Neutrophils Relative %: 60 % (ref 43.0–77.0)
Platelets: 225 10*3/uL (ref 150.0–400.0)
RBC: 4.07 Mil/uL (ref 3.87–5.11)
RDW: 14.3 % (ref 11.5–15.5)
WBC: 6.1 10*3/uL (ref 4.0–10.5)

## 2020-04-16 LAB — HEMOGLOBIN A1C: Hgb A1c MFr Bld: 5.6 % (ref 4.6–6.5)

## 2020-04-16 LAB — COMPREHENSIVE METABOLIC PANEL
ALT: 34 U/L (ref 0–35)
AST: 23 U/L (ref 0–37)
Albumin: 4.2 g/dL (ref 3.5–5.2)
Alkaline Phosphatase: 43 U/L (ref 39–117)
BUN: 16 mg/dL (ref 6–23)
CO2: 25 mEq/L (ref 19–32)
Calcium: 9 mg/dL (ref 8.4–10.5)
Chloride: 103 mEq/L (ref 96–112)
Creatinine, Ser: 0.87 mg/dL (ref 0.40–1.20)
GFR: 67.58 mL/min (ref >60.00)
Glucose, Bld: 92 mg/dL (ref 70–99)
Potassium: 4.2 mEq/L (ref 3.5–5.1)
Sodium: 136 mEq/L (ref 135–145)
Total Bilirubin: 0.8 mg/dL (ref 0.2–1.2)
Total Protein: 6.5 g/dL (ref 6.0–8.3)

## 2020-04-17 DIAGNOSIS — M4696 Unspecified inflammatory spondylopathy, lumbar region: Secondary | ICD-10-CM | POA: Diagnosis not present

## 2020-04-17 DIAGNOSIS — M545 Low back pain: Secondary | ICD-10-CM | POA: Diagnosis not present

## 2020-04-23 DIAGNOSIS — M1611 Unilateral primary osteoarthritis, right hip: Secondary | ICD-10-CM | POA: Diagnosis not present

## 2020-05-01 DIAGNOSIS — M1611 Unilateral primary osteoarthritis, right hip: Secondary | ICD-10-CM | POA: Diagnosis not present

## 2020-05-14 DIAGNOSIS — C44319 Basal cell carcinoma of skin of other parts of face: Secondary | ICD-10-CM | POA: Diagnosis not present

## 2020-06-09 DIAGNOSIS — Z471 Aftercare following joint replacement surgery: Secondary | ICD-10-CM | POA: Diagnosis not present

## 2020-06-09 DIAGNOSIS — Z96641 Presence of right artificial hip joint: Secondary | ICD-10-CM | POA: Diagnosis not present

## 2020-06-29 ENCOUNTER — Telehealth (INDEPENDENT_AMBULATORY_CARE_PROVIDER_SITE_OTHER): Payer: BLUE CROSS/BLUE SHIELD | Admitting: Physician Assistant

## 2020-06-29 ENCOUNTER — Other Ambulatory Visit: Payer: Self-pay

## 2020-06-29 ENCOUNTER — Ambulatory Visit (INDEPENDENT_AMBULATORY_CARE_PROVIDER_SITE_OTHER): Payer: BLUE CROSS/BLUE SHIELD | Admitting: Nurse Practitioner

## 2020-06-29 ENCOUNTER — Encounter: Payer: Self-pay | Admitting: Physician Assistant

## 2020-06-29 VITALS — Temp 98.7°F | Ht 65.0 in | Wt 165.0 lb

## 2020-06-29 VITALS — BP 142/90 | HR 85 | Temp 97.3°F | Ht 66.0 in | Wt 168.0 lb

## 2020-06-29 DIAGNOSIS — B349 Viral infection, unspecified: Secondary | ICD-10-CM | POA: Diagnosis not present

## 2020-06-29 DIAGNOSIS — Z20822 Contact with and (suspected) exposure to covid-19: Secondary | ICD-10-CM

## 2020-06-29 NOTE — Progress Notes (Signed)
Virtual Visit via Video   I connected with Emily Meyer on 06/29/20 at  9:00 AM EDT by a video enabled telemedicine application and verified that I am speaking with the correct person using two identifiers. Location patient: Home Location provider: Cassia Meyer, Office Persons participating in the virtual visit: Emily Meyer, Haith PA-C, Emily Pickler, LPN   I discussed the limitations of evaluation and management by telemedicine and the availability of in person appointments. The patient expressed understanding and agreed to proceed.  I acted as a Education administrator for [employer], PA-C [employer], LPN   Subjective:   HPI:   Patient is requesting evaluation for possible COVID-19.  Has had symptoms for two weeks. Had positive exposure to COVID at work. She reports that she never got tested because she assumed she had it. Felt improved over the weekend and then symptoms worsened when she woke up this morning. Has a test at noon today at [facility], results in 24-48 hours.  Did take some Aspirin. If she takes a deep breath and has pain in her lower lungs. She is having some chest pain.   Temperature 98.7 -- this is abnormally high for her, per her report.  Patient endorses the following symptoms: subjective fever, sinus headache, itchy watery eyes, ear fullness, sore throat, productive cough (clear sputum), wheezing, chest tightness and myalgia  Patient denies the following symptoms: sinus pain, rhinorrhea, ear pain, difficulty swallowing and shortness of breath   Patient risk factors: Current ZOXWR-60 risk of complications score: 0 Smoking status: Emily Meyer  reports that she has never smoked. She has never used smokeless tobacco. If female, currently pregnant? []   Yes [x]   No  ROS: See pertinent positives and negatives per HPI.  Patient Active Problem List   Diagnosis Date Noted  . Vitamin D deficiency 06/22/2018  . Chronic pansinusitis 06/15/2017  .  Pharyngoesophageal dysphagia 06/15/2017  . 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 Smoking status: Never Smoker  . Smokeless tobacco: Never Used  Substance Use Topics  . Alcohol use: No    Current Outpatient Medications:  .  Ascorbic Acid (VITAMIN C PO), Take by mouth daily., Disp: , Rfl:  .  b complex vitamins tablet, Take 1 tablet by mouth daily., Disp: , Rfl:  .  BETAINE PO, Take by mouth daily., Disp: , Rfl:  .  CALCIUM PO, Take by mouth daily., Disp: , Rfl:  .  COLLAGEN PO, Take by mouth daily., Disp: , Rfl:  .  Flaxseed, Linseed, (EQL FLAX SEED OIL) 1000 MG CAPS, Take 1,000 mg by mouth 2 (two) times daily., Disp: , Rfl:  .  Iodine, Kelp, (KELP PO), Take by mouth daily., Disp: , Rfl:  .  IRON PO, Take by mouth as needed., Disp: , Rfl:  .  Magnesium Oxide (MAG-CAPS PO), Take 1 capsule by mouth daily., Disp: , Rfl:  .  Zinc Sulfate (ZINC 15 PO), Take 1 tablet by mouth daily., Disp: , Rfl:   Allergies  Allergen Reactions  . Adhesive [Tape]     rash    Objective:   VITALS: Per patient if applicable, see vitals. GENERAL: Alert, appears well and in no acute distress. HEENT: Atraumatic, conjunctiva clear, no obvious abnormalities on inspection of external nose and ears. NECK: Normal movements of the head and neck. CARDIOPULMONARY: No increased WOB. Speaking in clear sentences. I:E ratio WNL.  MS: Moves all visible extremities without noticeable abnormality. PSYCH: Pleasant and cooperative,  well-groomed. Speech normal rate and rhythm. Affect is appropriate. Insight and judgement are appropriate. Attention is focused, linear, and appropriate.  NEURO: CN grossly intact. Oriented as arrived to appointment on time with no prompting. Moves both UE equally.  SKIN: No obvious lesions, wounds, erythema, or cyanosis noted on face or hands.  Assessment and Plan:   [name] was seen today for covid symptoms.  Diagnoses and all orders for this visit:  Suspected  COVID-19 virus infection   Discussed that due to her worsening symptoms and presence of chest pain, she needs to be seen in-person either at Urgent Care or the ER. Patient verbalized understanding.  I have asked my staff to call patient about appointment with the respiratory clinic however I recommended to patient that if she is unable to go until tomorrow that she still seek treatment today.  Patient verbalized understanding to plan and is in agreement.  I discussed the assessment and treatment plan with the patient. The patient was provided an opportunity to ask questions and all were answered. The patient agreed with the plan and demonstrated an understanding of the instructions.   The patient was advised to call back or seek an in-person evaluation if the symptoms worsen or if the condition fails to improve as anticipated.   CMA or LPN served as scribe during this visit. History, Physical, and Plan performed by medical provider. The above documentation has been reviewed and is accurate and complete.   [place], [place] 06/29/2020

## 2020-06-29 NOTE — Patient Instructions (Addendum)
Suspected Covid - 19 Cough:  Awaiting Covid test results  Will check labs and call with results  Stay well hydrated  Stay active  Deep breathing exercises  May start vitamin C 2,000 mg daily, vitamin D3 2,000 IU daily, Zinc 220 mg daily, and Quercetin 500 mg twice daily  May take tylenol or fever or pain  May take mucinex DM twice daily    Follow up:  As needed

## 2020-06-29 NOTE — Progress Notes (Signed)
@Patient  ID: Emily Meyer, female    DOB: 10/20/1965, 54 y.o.   MRN: [id number]  Chief Complaint  Patient presents with   COVID Symptoms    Sx started 3 weeks ago, fever, body aches, dry cough    Referring provider: Inda Coke, PA   54 year old female with history of chronic sinusitis, IBS, vitamin D deficiency.  HPI  Patient presents today for post COVID care clinic visit.  Patient has never tested positive for Covid.  She states that her symptoms started 3 weeks ago she has had fever, body aches, and dry cough.  She is concerned that she may have developed pneumonia.  Her PCP referred her here for evaluation of possible PE.  Patient states that she does have pain with deep inspiration.  We discussed that we will check labs today if D-dimer is elevated we can send her for a CTA. Denies f/c/s, n/v/d, hemoptysis, PND, chest pain or edema.       Allergies  Allergen Reactions   Adhesive [Tape]     rash     There is no immunization history on file for this patient.  Past Medical History:  Diagnosis Date   IBS (irritable bowel syndrome)    Nodular basal cell carcinoma (BCC) 03/09/2020   Right Malar Cheek   Right sided facial pain     Tobacco History: Social History   Tobacco Use  Smoking Status Never Smoker  Smokeless Tobacco Never Used   Counseling given: Not Answered   Outpatient Encounter Medications as of 06/29/2020  Medication Sig   Ascorbic Acid (VITAMIN C PO) Take by mouth daily.   b complex vitamins tablet Take 1 tablet by mouth daily.   BETAINE PO Take by mouth daily.   CALCIUM PO Take by mouth daily.   COLLAGEN PO Take by mouth daily.   Flaxseed, Linseed, (EQL FLAX SEED OIL) 1000 MG CAPS Take 1,000 mg by mouth 2 (two) times daily.   Iodine, Kelp, (KELP PO) Take by mouth daily.   IRON PO Take by mouth as needed.   Magnesium Oxide (MAG-CAPS PO) Take 1 capsule by mouth daily.   Zinc Sulfate (ZINC 15 PO) Take 1 tablet by mouth daily.    EQ ASPIRIN ADULT LOW DOSE 81 MG EC tablet Take by mouth.   No facility-administered encounter medications on file as of 06/29/2020.     Review of Systems  Review of Systems  Constitutional: Positive for fatigue.  HENT: Negative.   Respiratory: Positive for cough and shortness of breath.   Cardiovascular: Negative.  Negative for chest pain, palpitations and leg swelling.  Gastrointestinal: Negative.   Musculoskeletal: Positive for myalgias.  Allergic/Immunologic: Negative.   Neurological: Negative.   Psychiatric/Behavioral: Negative.        Physical Exam  BP (!) 142/90 (BP Location: Left Arm)    Pulse 85    Temp (!) 97.3 F (36.3 C)    Ht 5\' 6"  (1.676 m)    Wt 168 lb (76.2 kg)    LMP 06/17/2020    SpO2 95%    BMI 27.12 kg/m   Wt Readings from Last 5 Encounters:  06/29/20 168 lb (76.2 kg)  06/29/20 165 lb (74.8 kg)  04/15/20 173 lb (78.5 kg)  12/31/19 169 lb 8 oz (76.9 kg)  03/14/19 160 lb (72.6 kg)     Physical Exam Vitals and nursing note reviewed.  Constitutional:      General: She is not in acute distress.    Appearance: She is  well-developed.  Cardiovascular:     Rate and Rhythm: Normal rate and regular rhythm.  Pulmonary:     Effort: Pulmonary effort is normal.     Breath sounds: Normal breath sounds.  Neurological:     Mental Status: She is alert and oriented to person, place, and time.        Assessment & Plan:   Viral illness Suspected Covid - 19 Cough:  Awaiting Covid test results  Will check labs and call with results  Stay well hydrated  Stay active  Deep breathing exercises  May start vitamin C 2,000 mg daily, vitamin D3 2,000 IU daily, Zinc 220 mg daily, and Quercetin 500 mg twice daily  May take tylenol or fever or pain  May take mucinex DM twice daily    Follow up:  As needed      Fenton Foy, NP 06/30/2020

## 2020-06-30 ENCOUNTER — Other Ambulatory Visit: Payer: Self-pay | Admitting: Nurse Practitioner

## 2020-06-30 ENCOUNTER — Encounter: Payer: Self-pay | Admitting: Physician Assistant

## 2020-06-30 DIAGNOSIS — B349 Viral infection, unspecified: Secondary | ICD-10-CM | POA: Insufficient documentation

## 2020-06-30 DIAGNOSIS — R071 Chest pain on breathing: Secondary | ICD-10-CM

## 2020-06-30 NOTE — Assessment & Plan Note (Signed)
Suspected Covid - 19 Cough:  Awaiting Covid test results  Will check labs and call with results  Stay well hydrated  Stay active  Deep breathing exercises  May start vitamin C 2,000 mg daily, vitamin D3 2,000 IU daily, Zinc 220 mg daily, and Quercetin 500 mg twice daily  May take tylenol or fever or pain  May take mucinex DM twice daily    Follow up:  As needed

## 2020-07-01 ENCOUNTER — Other Ambulatory Visit: Payer: Self-pay | Admitting: Nurse Practitioner

## 2020-07-01 ENCOUNTER — Other Ambulatory Visit: Payer: Self-pay

## 2020-07-01 ENCOUNTER — Ambulatory Visit
Admission: RE | Admit: 2020-07-01 | Discharge: 2020-07-01 | Disposition: A | Discharge status: Home / Self Care | Payer: BLUE CROSS/BLUE SHIELD | Source: Ambulatory Visit | Attending: Nurse Practitioner | Admitting: Nurse Practitioner

## 2020-07-01 DIAGNOSIS — R0602 Shortness of breath: Secondary | ICD-10-CM

## 2020-07-01 LAB — COMPREHENSIVE METABOLIC PANEL
ALT: 17 IU/L (ref 0–32)
AST: 21 IU/L (ref 0–40)
Albumin/Globulin Ratio: 1.6 (ref 1.2–2.2)
Albumin: 4.3 g/dL (ref 3.8–4.9)
Alkaline Phosphatase: 62 IU/L (ref 44–121)
BUN/Creatinine Ratio: 24 — ABNORMAL HIGH (ref 9–23)
BUN: 17 mg/dL (ref 6–24)
Bilirubin Total: 0.2 mg/dL (ref 0.0–1.2)
CO2: 16 mmol/L — ABNORMAL LOW (ref 20–29)
Calcium: 9 mg/dL (ref 8.7–10.2)
Chloride: 103 mmol/L (ref 96–106)
Creatinine, Ser: 0.7 mg/dL (ref 0.57–1.00)
GFR calc Af Amer: 113 mL/min/{1.73_m2} (ref >59)
GFR calc non Af Amer: 98 mL/min/{1.73_m2} (ref >59)
Globulin, Total: 2.7 g/dL (ref 1.5–4.5)
Glucose: 75 mg/dL (ref 65–99)
Potassium: 5.4 mmol/L — ABNORMAL HIGH (ref 3.5–5.2)
Sodium: 139 mmol/L (ref 134–144)
Total Protein: 7 g/dL (ref 6.0–8.5)

## 2020-07-01 LAB — CBC
Hematocrit: 38.2 % (ref 34.0–46.6)
Hemoglobin: 12.5 g/dL (ref 11.1–15.9)
MCH: 29.5 pg (ref 26.6–33.0)
MCHC: 32.7 g/dL (ref 31.5–35.7)
MCV: 90 fL (ref 79–97)
Platelets: 425 10*3/uL (ref 150–450)
RBC: 4.24 x10E6/uL (ref 3.77–5.28)
RDW: 12.7 % (ref 11.7–15.4)
WBC: 7.5 10*3/uL (ref 3.4–10.8)

## 2020-07-01 LAB — D-DIMER, QUANTITATIVE: D-DIMER: 1.57 mg/L FEU — ABNORMAL HIGH (ref 0.00–0.49)

## 2020-07-02 ENCOUNTER — Other Ambulatory Visit: Payer: Self-pay | Admitting: Nurse Practitioner

## 2020-07-02 MED ORDER — PREDNISONE 10 MG PO TABS: ORAL_TABLET | ORAL | 0 refills | Status: DC

## 2020-07-02 MED ORDER — AZITHROMYCIN 250 MG PO TABS: ORAL_TABLET | ORAL | 0 refills | Status: AC

## 2020-07-02 MED ORDER — AMOXICILLIN 875 MG PO TABS: 875.0000 mg | ORAL_TABLET | Freq: Two times a day (BID) | ORAL | 0 refills | Status: AC

## 2020-07-30 ENCOUNTER — Ambulatory Visit
Admission: RE | Admit: 2020-07-30 | Discharge: 2020-07-30 | Disposition: A | Discharge status: Home / Self Care | Payer: BLUE CROSS/BLUE SHIELD | Source: Ambulatory Visit | Attending: Nurse Practitioner | Admitting: Nurse Practitioner

## 2020-07-30 ENCOUNTER — Ambulatory Visit (INDEPENDENT_AMBULATORY_CARE_PROVIDER_SITE_OTHER): Payer: BLUE CROSS/BLUE SHIELD | Admitting: Nurse Practitioner

## 2020-07-30 VITALS — BP 114/78 | HR 77 | Temp 96.8°F | Wt 167.0 lb

## 2020-07-30 DIAGNOSIS — Z8619 Personal history of other infectious and parasitic diseases: Secondary | ICD-10-CM | POA: Diagnosis not present

## 2020-07-30 DIAGNOSIS — J189 Pneumonia, unspecified organism: Secondary | ICD-10-CM

## 2020-07-30 DIAGNOSIS — R059 Cough, unspecified: Secondary | ICD-10-CM | POA: Diagnosis not present

## 2020-07-30 DIAGNOSIS — B349 Viral infection, unspecified: Secondary | ICD-10-CM | POA: Diagnosis not present

## 2020-07-30 DIAGNOSIS — Z20822 Contact with and (suspected) exposure to covid-19: Secondary | ICD-10-CM | POA: Diagnosis not present

## 2020-07-30 NOTE — Patient Instructions (Addendum)
Covid 19 Cough Fatigue:   Stay well hydrated  Stay active  Deep breathing exercises  May take tylenol or fever or pain  May take mucinex DM twice daily  Will order chest x ray   Follow up:  Follow up as needed

## 2020-07-30 NOTE — Assessment & Plan Note (Signed)
Cough Fatigue:   Stay well hydrated  Stay active  Deep breathing exercises  May take tylenol or fever or pain  May take mucinex DM twice daily  Will order chest x ray  Will check covid antibodies today   Follow up:  Follow up as needed

## 2020-07-30 NOTE — Progress Notes (Signed)
@Patient  ID: Emily Meyer, female    DOB: Feb 15, 1966, 54 y.o.   MRN: [id number]  Chief Complaint  Patient presents with  . Follow-up    Still having fatigue, cough. Finished prednisone from last OV    Referring provider: Inda Coke, PA   54 year old female with history of chronic sinusitis, IBS, vitamin D deficiency.  HPI  Patient presents today for post COVID care clinic visit follow-up.  At her last visit here she had a chest x-ray which did show pneumonia.  Patient was treated with amoxicillin, azithromycin, prednisone.  She states that she is feeling much improved.  She no longer has a significant shortness of breath.  She does still have a slight cough.  We will check a repeat x-ray today. Denies f/c/s, n/v/d, hemoptysis, PND,  chest pain or edema.       Allergies  Allergen Reactions  . Adhesive [Tape]     rash     There is no immunization history on file for this patient.  Past Medical History:  Diagnosis Date  . IBS (irritable bowel syndrome)   . 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 Right sided facial pain     Tobacco History: Social History   Tobacco Use  Smoking Status Never Smoker  Smokeless Tobacco Never Used   Counseling given: Not Answered   Outpatient Encounter Medications as of 07/30/2020  Medication Sig  . Ascorbic Acid (VITAMIN C PO) Take by mouth daily.  Marland Kitchen b complex vitamins tablet Take 1 tablet by mouth daily.  Marland Kitchen BETAINE PO Take by mouth daily.  Marland Kitchen CALCIUM PO Take by mouth daily.  . COLLAGEN PO Take by mouth daily.  Noelle Penner ASPIRIN ADULT LOW DOSE 81 MG EC tablet Take by mouth.  . Flaxseed, Linseed, (EQL FLAX SEED OIL) 1000 MG CAPS Take 1,000 mg by mouth 2 (two) times daily.  . Iodine, Kelp, (KELP PO) Take by mouth daily.  . IRON PO Take by mouth as needed.  . Magnesium Oxide (MAG-CAPS PO) Take 1 capsule by mouth daily.  . Zinc Sulfate (ZINC 15 PO) Take 1 tablet by mouth daily.  . [DISCONTINUED]  predniSONE (DELTASONE) 10 MG tablet Take 4 tabs for 2 days, then 3 tabs for 2 days, then 2 tabs for 2 days, then 1 tab for 2 days, then stop   No facility-administered encounter medications on file as of 07/30/2020.     Review of Systems  Review of Systems  Constitutional: Positive for fatigue. Negative for fever.  HENT: Negative.   Respiratory: Positive for cough. Negative for shortness of breath.   Cardiovascular: Negative.  Negative for chest pain, palpitations and leg swelling.  Gastrointestinal: Negative.   Allergic/Immunologic: Negative.   Neurological: Negative.   Psychiatric/Behavioral: Negative.        Physical Exam  BP 114/78 (BP Location: Left Arm)   Pulse 77   Temp (!) 96.8 F (36 C)   Wt 167 lb (75.8 kg)   LMP 07/21/2020   SpO2 97%   BMI 26.95 kg/m   Wt Readings from Last 5 Encounters:  07/30/20 167 lb (75.8 kg)  06/29/20 168 lb (76.2 kg)  06/29/20 165 lb (74.8 kg)  04/15/20 173 lb (78.5 kg)  12/31/19 169 lb 8 oz (76.9 kg)     Physical Exam Vitals and nursing note reviewed.  Constitutional:      General: She is not in acute distress.    Appearance: She is well-developed.  Cardiovascular:  Rate and Rhythm: Normal rate and regular rhythm.  Pulmonary:     Effort: Pulmonary effort is normal.     Breath sounds: Normal breath sounds.  Neurological:     Mental Status: She is alert and oriented to person, place, and time.       Imaging: DG Chest 2 View  Result Date: 07/01/2020 CLINICAL DATA:  Cough, shortness of breath for 3 weeks, pain with inspiration EXAM: CHEST - 2 VIEW COMPARISON:  01/19/2006 FINDINGS: Frontal and lateral views of the chest demonstrate an unremarkable cardiac silhouette. There are patchy bibasilar areas of consolidation within the lower lobes. Additionally, nodular opacities within the right lower lobe measuring 1 cm and left upper lobe measuring 1.2 cm are noted. No effusion or pneumothorax. No acute bony abnormalities.  IMPRESSION: 1. Bilateral lower lobe airspace disease and nodular opacities as above. Given clinical history, multifocal pneumonia could give this appearance. Followup PA and lateral chest X-ray is recommended in 3-4 weeks following trial of antibiotic therapy to ensure resolution and exclude underlying malignancy. If there are no current signs or symptoms of infection, CT scan could be considered. These results will be called to the ordering clinician or representative by the Radiologist Assistant, and communication documented in the PACS or [employer]. Electronically Signed   By: Randa Ngo M.D.   On: 07/01/2020 21:21     Assessment & Plan:   Viral illness Cough Fatigue:   Stay well hydrated  Stay active  Deep breathing exercises  May take tylenol or fever or pain  May take mucinex DM twice daily  Will order chest x ray  Will check covid antibodies today   Follow up:  Follow up as needed      Fenton Foy, NP 07/30/2020

## 2020-07-31 LAB — SAR COV2 SEROLOGY (COVID19)AB(IGG),IA: DiaSorin SARS-CoV-2 Ab, IgG: POSITIVE

## 2020-08-03 ENCOUNTER — Other Ambulatory Visit: Payer: Self-pay | Admitting: Nurse Practitioner

## 2020-08-03 DIAGNOSIS — J189 Pneumonia, unspecified organism: Secondary | ICD-10-CM

## 2020-08-03 DIAGNOSIS — Z8619 Personal history of other infectious and parasitic diseases: Secondary | ICD-10-CM

## 2020-08-06 ENCOUNTER — Other Ambulatory Visit: Payer: Self-pay | Admitting: Nurse Practitioner

## 2020-08-06 DIAGNOSIS — R5383 Other fatigue: Secondary | ICD-10-CM

## 2020-08-06 DIAGNOSIS — Z8619 Personal history of other infectious and parasitic diseases: Secondary | ICD-10-CM

## 2020-08-09 NOTE — Progress Notes (Signed)
Cardiology Office Note:   Date:  08/10/2020  NAME:  Emily Meyer    MRN: [id number] DOB:  March 17, 1966   PCP:  Inda Coke, PA  Cardiologist:  Evalina Field, MD   Referring MD: Fenton Foy, NP   Chief Complaint  Patient presents with  . Shortness of Breath   History of Present Illness:   Emily Meyer is a 54 y.o. female with a hx of covid 19 PNA who is being seen today for the evaluation of SOB at the request of Fenton Foy, NP.  She reports she recently had COVID-19 pneumonia.  Since August 2021 she is experienced daily episodes of shortness of breath and palpitations.  She reports he has no energy and is fatigued.  She also reports a daily sensation of fluttering in her chest.  Symptoms occur with exertion.  They can also occur with rest.  Recent chest x-ray with resolution of bronchopneumonia.  She does have a spiculated lung nodule that needs further evaluation.  She also is noted to have an elevated D-dimer and a CT PE study was ordered.  This has not been completed yet.  She reports no prior medical problems.  She is never had a heart attack or stroke.  Her EKG demonstrates normal sinus rhythm.  She reports that her father died suddenly at age 40.  She was evaluated when she turned 35 and no cardiac pathology was identified.  Her blood pressure is 119/85.  She is not had a recent thyroid test.  She has no evidence of heart failure.  She describes no exertional chest pain or pressure.  Her most recent lab work demonstrates a total cholesterol of 214, HDL 79, LDL 121, triglycerides 67.  She is not diabetic with an A1c of 5.6.  She is a never smoker.  She consumes alcohol moderation.  She is not anemic her recent lab work.  Cardiovascular exam is normal.  Her symptoms of palpitations and shortness of breath have not gotten any better.  She does report a history of PVCs.  Never had evaluation.  Past Medical History: Past Medical History:  Diagnosis Date  . IBS (irritable bowel  syndrome)   . 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 Right sided facial pain     Past Surgical History: Past Surgical History:  Procedure Laterality Date  . TOTAL HIP ARTHROPLASTY      Current Medications: Current Meds  Medication Sig  . Ascorbic Acid (VITAMIN C PO) Take by mouth daily.  Marland Kitchen b complex vitamins tablet Take 1 tablet by mouth daily.  Marland Kitchen BETAINE PO Take by mouth daily.  Marland Kitchen CALCIUM PO Take by mouth daily.  . COLLAGEN PO Take by mouth daily.  Noelle Penner ASPIRIN ADULT LOW DOSE 81 MG EC tablet Take by mouth.  . Flaxseed, Linseed, (EQL FLAX SEED OIL) 1000 MG CAPS Take 1,000 mg by mouth 2 (two) times daily.  . Iodine, Kelp, (KELP PO) Take by mouth daily.  . IRON PO Take by mouth as needed.  . Magnesium Oxide (MAG-CAPS PO) Take 1 capsule by mouth daily.  . Zinc Sulfate (ZINC 15 PO) Take 1 tablet by mouth daily.     Allergies:    Adhesive [tape]   Social History: Social History   Socioeconomic History  . Marital status: Divorced    Spouse name: Not on file  . Number of children: 2  . Years of education: 73  . Highest education level:  Associate degree: occupational, technical, or vocational program  Occupational History  . Occupation: Therapist, art Rep    Employer: [employer]  Tobacco Use  . Smoking status: Never Smoker  . Smokeless tobacco: Never Used  Vaping Use  . Vaping Use: Never used  Substance and Sexual Activity  . Alcohol use: No  . Drug use: No  . Sexual activity: Never  Other Topics Concern  . Not on file  Social History Narrative   Divorced in 2018   Older daughter lives with her, currently unemployed   Lives with younger daughter   Education officer, community   Social Determinants of Health   Financial Resource Strain:   . Difficulty of Paying Living Expenses: Not on file  Food Insecurity:   . Worried About Charity fundraiser in the Last Year: Not on file  . Ran Out of Food in the Last Year: Not  on file  Transportation Needs:   . Lack of Transportation (Medical): Not on file  . Lack of Transportation (Non-Medical): Not on file  Physical Activity:   . Days of Exercise per Week: Not on file  . Minutes of Exercise per Session: Not on file  Stress:   . Feeling of Stress : Not on file  Social Connections:   . Frequency of Communication with Friends and Family: Not on file  . Frequency of Social Gatherings with Friends and Family: Not on file  . Attends Religious Services: Not on file  . Active Member of Clubs or Organizations: Not on file  . Attends Archivist Meetings: Not on file  . Marital Status: Not on file    Family History: The patient's family history includes AAA (abdominal aortic aneurysm) in her father; Breast cancer in her paternal aunt; Diabetes in her brother; Healthy in her mother; Hypertension in her father. There is no history of Colon cancer.  ROS:   All other ROS reviewed and negative. Pertinent positives noted in the HPI.     EKGs/Labs/Other Studies Reviewed:   The following studies were personally reviewed by me today:  EKG:  EKG is ordered today.  The ekg ordered today demonstrates normal sinus rhythm, heart rate 79, low voltage, and was personally reviewed by me.   Recent Labs: 06/29/2020: ALT 17; BUN 17; Creatinine, Ser 0.70; Hemoglobin 12.5; Platelets 425; Potassium 5.4; Sodium 139   Recent Lipid Panel    Component Value Date/Time   CHOL 214 (H) 12/31/2019 0917   TRIG 67.0 12/31/2019 0917   HDL 79.30 12/31/2019 0917   CHOLHDL 3 12/31/2019 0917   VLDL 13.4 12/31/2019 0917   LDLCALC 121 (H) 12/31/2019 0917    Physical Exam:   VS:  BP 119/85   Pulse 79   Temp (!) 93.7 F (34.3 C)   Ht 5\' 5"  (1.651 m)   Wt 169 lb 12.8 oz (77 kg)   LMP 07/21/2020   SpO2 98%   BMI 28.26 kg/m    Wt Readings from Last 3 Encounters:  08/10/20 169 lb 12.8 oz (77 kg)  07/30/20 167 lb (75.8 kg)  06/29/20 168 lb (76.2 kg)    General: Well nourished,  well developed, in no acute distress Heart: Atraumatic, normal size  Eyes: PEERLA, EOMI  Neck: Supple, no JVD Endocrine: No thryomegaly Cardiac: Normal S1, S2; RRR; no murmurs, rubs, or gallops Lungs: Clear to auscultation bilaterally, no wheezing, rhonchi or rales  Abd: Soft, nontender, no hepatomegaly  Ext: No edema, pulses 2+ Musculoskeletal: No deformities,  BUE and BLE strength normal and equal Skin: Warm and dry, no rashes   Neuro: Alert and oriented to person, place, time, and situation, CNII-XII grossly intact, no focal deficits  Psych: Normal mood and affect   ASSESSMENT:   Emily Meyer is a 54 y.o. female who presents for the following: 1. SOB (shortness of breath) on exertion   2. Palpitations   3. Chest pain, unspecified type     PLAN:   1. SOB (shortness of breath) on exertion 2. Palpitations -She reports daily episodes of shortness of breath and palpitations since COVID-19 pneumonia.  She has a spiculated lung nodule.  She has an elevated D-dimer.  We have reordered her CT PE study.  This is indicated.  She is not hypoxic or tachycardic today.  I think she can get this done as an outpatient. -She reports fatigue.  We need to check a TSH. -She has no evidence of heart failure on examination.  We will check a BNP as well as echocardiogram.  We will get a good look at strain imaging to determine if she has any subclinical LV dysfunction.  Her EKG shows normal sinus rhythm with no ischemic changes.  I have a low suspicion for obstructive CAD.  When she gets her CT PE study we will determine if she has any coronary calcium.  Overall I feel her symptoms are more likely related to COVID-19 pneumonia and residual symptoms. -Her palpitations could be arrhythmia related.  We will need to exclude any arrhythmia with a 7-day Zio patch. -We will proceed with the above work-up and I will see her back in 3 months.  Disposition: Return in about 3 months (around 11/10/2020).  Medication  Adjustments/Labs and Tests Ordered: Current medicines are reviewed at length with the patient today.  Concerns regarding medicines are outlined above.  Orders Placed This Encounter  Procedures  . CT ANGIO CHEST PE W OR WO CONTRAST  . Brain natriuretic peptide  . TSH  . D-dimer, quantitative (not at [facility])  . Basic metabolic panel  . LONG TERM MONITOR (3-14 DAYS)  . EKG 12-Lead  . ECHOCARDIOGRAM COMPLETE   No orders of the defined types were placed in this encounter.   Patient Instructions  Medication Instructions:  No changes *If you need a refill on your cardiac medications before your next appointment, please call your pharmacy*   Lab Work:  TSH BNP  Possible will need a BMP ,d-dimer depending on ct scan   If you have labs (blood work) drawn today and your tests are completely normal, you will receive your results only by: Marland Kitchen MyChart Message (if you have MyChart) OR . A paper copy in the mail If you have any lab test that is abnormal or we need to change your treatment, we will call you to review the results.   Testing/Procedures:  will be schedule at [facility] has requested that you have an echocardiogram. Echocardiography is a painless test that uses sound waves to create images of your heart. It provides your doctor with information about the size and shape of your heart and how well your heart's chambers and valves are working. This procedure takes approximately one hour. There are no restrictions for this procedure.   And  Will be schedule at [address] [place] Non-Cardiac CT Angiography (CTA ) of chest, is a special type of CT scan that uses a computer to produce multi-dimensional views of major  blood vessels throughout the body. In CT angiography, a contrast material is injected through an IV to help visualize the blood vessels   And   Your physician has recommended that you wear a 7 DAY ZIO-PATCH  monitor. The Zio patch cardiac monitor continuously records heart rhythm data, this is for patients being evaluated for multiple types heart rhythms. For the first 24 hours post application, please avoid getting the Zio monitor wet in the shower or by excessive sweating during exercise. After that, feel free to carry on with regular activities. Keep soaps and lotions away from the ZIO XT Patch.  Someone will be calling you to let you know when this is mailed.  This will be mailed to you, please expect 7-10 days to receive.      Call [place] at [phone] if you have questions regarding your ZIO XT patch monitor.  Call them immediately if you see an orange light blinking on your monitor.   If your monitor falls off in less than 4 days contact our Monitor department at [phone].  If your monitor becomes loose or falls off after 4 days call [employer] at [phone] for suggestions on securing your monitor    Follow-Up: At [facility], you and your health needs are our priority.  As part of our continuing mission to provide you with exceptional heart care, we have created designated Provider Care Teams.  These Care Teams include your primary Cardiologist (physician) and Advanced Practice Providers (APPs -  Physician Assistants and Nurse Practitioners) who all work together to provide you with the care you need, when you need it.  We recommend signing up for the patient portal called "MyChart".  Sign up information is provided on this After Visit Summary.  MyChart is used to connect with patients for Virtual Visits (Telemedicine).  Patients are able to view lab/test results, encounter notes, upcoming appointments, etc.  Non-urgent messages can be sent to your provider as well.   To learn more about what you can do with MyChart, go to [email or web address].    Your next appointment:   3 month(s)  The format for your next appointment:   In Person  Provider:     Eleonore Chiquito, MD   Other Instructions  ZIO XT- Long Term Monitor Instructions   Your physician has requested you wear your ZIO patch monitor__7_____days.   This is a single patch monitor.  [employer] supplies one patch monitor per enrollment.  Additional stickers are not available.   Please do not apply patch if you will be having a Nuclear Stress Test, Echocardiogram, Cardiac CT, MRI, or Chest Xray during the time frame you would be wearing the monitor. The patch cannot be worn during these tests.  You cannot remove and re-apply the ZIO XT patch monitor.   Your ZIO patch monitor will be sent USPS Priority mail from [facility] directly to your home address. The monitor may also be mailed to a PO BOX if home delivery is not available.   It may take 3-5 days to receive your monitor after you have been enrolled.   Once you have received you monitor, please review enclosed instructions.  Your monitor has already been registered assigning a specific monitor serial # to you.   Applying the monitor   Shave hair from upper left chest.   Hold abrader disc by orange tab.  Rub abrader in 40 strokes over left upper chest as indicated in your monitor instructions.   Clean area  with 4 enclosed alcohol pads .  Use all pads to assure are is cleaned thoroughly.  Let dry.   Apply patch as indicated in monitor instructions.  Patch will be place under collarbone on left side of chest with arrow pointing upward.   Rub patch adhesive wings for 2 minutes.Remove white label marked "1".  Remove white label marked "2".  Rub patch adhesive wings for 2 additional minutes.   While looking in a mirror, press and release button in center of patch.  A small green light will flash 3-4 times .  This will be your only indicator the monitor has been turned on.     Do not shower for the first 24 hours.  You may shower after the first 24 hours.   Press button if you feel a symptom. You will hear a small click.   Record Date, Time and Symptom in the Patient Log Book.   When you are ready to remove patch, follow instructions on last 2 pages of Patient Log Book.  Stick patch monitor onto last page of Patient Log Book.   Place Patient Log Book in [place] box.  Use locking tab on box and tape box closed securely.  The [employer] and [employer] has [employer] on it.  Please place in mailbox as soon as possible.  Your physician should have your test results approximately 7 days after the monitor has been mailed back to [facility].   Call [place] at [phone] if you have questions regarding your ZIO XT patch monitor.  Call them immediately if you see an orange light blinking on your monitor.   If your monitor falls off in less than 4 days contact our Monitor department at [phone].  If your monitor becomes loose or falls off after 4 days call [employer] at [phone] for suggestions on securing your monitor.        Signed, Addison Naegeli. Audie Box, [place]  [address] [place],  [address] [phone]  08/10/2020 5:05 PM

## 2020-08-10 ENCOUNTER — Encounter: Payer: Self-pay | Admitting: Cardiovascular Disease

## 2020-08-10 ENCOUNTER — Other Ambulatory Visit: Payer: Self-pay

## 2020-08-10 ENCOUNTER — Ambulatory Visit (INDEPENDENT_AMBULATORY_CARE_PROVIDER_SITE_OTHER): Payer: BLUE CROSS/BLUE SHIELD | Admitting: Cardiovascular Disease

## 2020-08-10 VITALS — BP 119/85 | HR 79 | Temp 93.7°F | Ht 65.0 in | Wt 169.8 lb

## 2020-08-10 DIAGNOSIS — R002 Palpitations: Secondary | ICD-10-CM

## 2020-08-10 DIAGNOSIS — R0602 Shortness of breath: Secondary | ICD-10-CM

## 2020-08-10 DIAGNOSIS — R079 Chest pain, unspecified: Secondary | ICD-10-CM

## 2020-08-10 NOTE — Patient Instructions (Signed)
Medication Instructions:  No changes *If you need a refill on your cardiac medications before your next appointment, please call your pharmacy*   Lab Work:  TSH BNP  Possible will need a BMP ,d-dimer depending on ct scan   If you have labs (blood work) drawn today and your tests are completely normal, you will receive your results only by: Marland Kitchen MyChart Message (if you have MyChart) OR . A paper copy in the mail If you have any lab test that is abnormal or we need to change your treatment, we will call you to review the results.   Testing/Procedures:  will be schedule at [facility] has requested that you have an echocardiogram. Echocardiography is a painless test that uses sound waves to create images of your heart. It provides your doctor with information about the size and shape of your heart and how well your heart's chambers and valves are working. This procedure takes approximately one hour. There are no restrictions for this procedure.   And  Will be schedule at [place] imaging Non-Cardiac CT Angiography (CTA ) of chest, is a special type of CT scan that uses a computer to produce multi-dimensional views of major blood vessels throughout the body. In CT angiography, a contrast material is injected through an IV to help visualize the blood vessels   And   Your physician has recommended that you wear a 7 DAY ZIO-PATCH monitor. The Zio patch cardiac monitor continuously records heart rhythm data, this is for patients being evaluated for multiple types heart rhythms. For the first 24 hours post application, please avoid getting the Zio monitor wet in the shower or by excessive sweating during exercise. After that, feel free to carry on with regular activities. Keep soaps and lotions away from the ZIO XT Patch.  Someone will be calling you to let you know when this is mailed.  This will be mailed to you, please expect 7-10 days  to receive.      Call [place] at [phone] if you have questions regarding your ZIO XT patch monitor.  Call them immediately if you see an orange light blinking on your monitor.   If your monitor falls off in less than 4 days contact our Monitor department at [phone].  If your monitor becomes loose or falls off after 4 days call [employer] at [phone] for suggestions on securing your monitor    Follow-Up: At [facility], you and your health needs are our priority.  As part of our continuing mission to provide you with exceptional heart care, we have created designated Provider Care Teams.  These Care Teams include your primary Cardiologist (physician) and Advanced Practice Providers (APPs -  Physician Assistants and Nurse Practitioners) who all work together to provide you with the care you need, when you need it.  We recommend signing up for the patient portal called "MyChart".  Sign up information is provided on this After Visit Summary.  MyChart is used to connect with patients for Virtual Visits (Telemedicine).  Patients are able to view lab/test results, encounter notes, upcoming appointments, etc.  Non-urgent messages can be sent to your provider as well.   To learn more about what you can do with MyChart, go to [email or web address].    Your next appointment:   3 month(s)  The format for your next appointment:   In Person  Provider:    Eleonore Chiquito, MD  Other Instructions  ZIO XT- Long Term Monitor Instructions   Your physician has requested you wear your ZIO patch monitor__7_____days.   This is a single patch monitor.  [employer] supplies one patch monitor per enrollment.  Additional stickers are not available.   Please do not apply patch if you will be having a Nuclear Stress Test, Echocardiogram, Cardiac CT, MRI, or Chest Xray during the time frame you would be wearing the monitor. The patch cannot be worn during these tests.  You  cannot remove and re-apply the ZIO XT patch monitor.   Your ZIO patch monitor will be sent USPS Priority mail from [facility] directly to your home address. The monitor may also be mailed to a PO BOX if home delivery is not available.   It may take 3-5 days to receive your monitor after you have been enrolled.   Once you have received you monitor, please review enclosed instructions.  Your monitor has already been registered assigning a specific monitor serial # to you.   Applying the monitor   Shave hair from upper left chest.   Hold abrader disc by orange tab.  Rub abrader in 40 strokes over left upper chest as indicated in your monitor instructions.   Clean area with 4 enclosed alcohol pads .  Use all pads to assure are is cleaned thoroughly.  Let dry.   Apply patch as indicated in monitor instructions.  Patch will be place under collarbone on left side of chest with arrow pointing upward.   Rub patch adhesive wings for 2 minutes.Remove white label marked "1".  Remove white label marked "2".  Rub patch adhesive wings for 2 additional minutes.   While looking in a mirror, press and release button in center of patch.  A small green light will flash 3-4 times .  This will be your only indicator the monitor has been turned on.     Do not shower for the first 24 hours.  You may shower after the first 24 hours.   Press button if you feel a symptom. You will hear a small click.  Record Date, Time and Symptom in the Patient Log Book.   When you are ready to remove patch, follow instructions on last 2 pages of Patient Log Book.  Stick patch monitor onto last page of Patient Log Book.   Place Patient Log Book in [place] box.  Use locking tab on box and tape box closed securely.  The [employer] has [employer] on it.  Please place in mailbox as soon as possible.  Your physician should have your test results approximately 7 days after the monitor has been mailed back to  [facility].   Call [place] at [phone] if you have questions regarding your ZIO XT patch monitor.  Call them immediately if you see an orange light blinking on your monitor.   If your monitor falls off in less than 4 days contact our Monitor department at [phone].  If your monitor becomes loose or falls off after 4 days call [employer] at [phone] for suggestions on securing your monitor.

## 2020-08-11 ENCOUNTER — Encounter: Payer: Self-pay | Admitting: *Deleted

## 2020-08-11 LAB — TSH: TSH: 1.1 u[IU]/mL (ref 0.450–4.500)

## 2020-08-11 LAB — BRAIN NATRIURETIC PEPTIDE: BNP: 9.1 pg/mL (ref 0.0–100.0)

## 2020-08-11 NOTE — Progress Notes (Signed)
Patient ID: Emily Meyer, female   DOB: Apr 03, 1966, 54 y.o.   MRN: [id number] Patient enrolled for [employer] to ship a 7 day ZIO XT long term holter monitor to her home.

## 2020-08-13 ENCOUNTER — Other Ambulatory Visit: Payer: Self-pay

## 2020-08-13 ENCOUNTER — Ambulatory Visit: Payer: BLUE CROSS/BLUE SHIELD | Admitting: Internal Medicine

## 2020-08-13 ENCOUNTER — Encounter: Payer: Self-pay | Admitting: Internal Medicine

## 2020-08-13 DIAGNOSIS — U071 COVID-19: Secondary | ICD-10-CM

## 2020-08-13 DIAGNOSIS — J1282 Pneumonia due to coronavirus disease 2019: Secondary | ICD-10-CM | POA: Diagnosis not present

## 2020-08-13 NOTE — Progress Notes (Signed)
Emily Meyer, female    DOB: 02-Aug-1966,    MRN: [id number]   Brief patient profile:  89 yowf never smoker onset of symptoms Jun 16 2020 p exposure to office fever, aches, anorexia, anosmia, dry cough gradually improved s testing or treatment then Sept 13th worse again with cough sob and pain upper ant chest rx   > amox/zpak/prednisone > 80% improved bt 07/30/20 cxr is clear x for one nodule in LUL > Covid Ab pos not quantitated > eval by Cards "chest felt weak" > concerned about prior d dimer so CTa ordered      History of Present Illness  08/13/2020  Pulmonary/ 1st office eval/Emily Meyer  Chief Complaint  Patient presents with  . Consult    SOB/ dry cough   Dyspnea:  Doe x flight of steps but now doing step ex x 7 min and has to stop  vs prior = 20 - 30 min not checking sats  Cough: some dry mostly daytime  Sleep: fine flat  SABA use: none   No obvious day to day or daytime variability or assoc excess/ purulent sputum or mucus plugs or hemoptysis or cp or chest tightness, subjective wheeze or overt sinus or hb symptoms.   Sleeping  without nocturnal  or early am exacerbation  of respiratory  c/o's or need for noct saba. Also denies any obvious fluctuation of symptoms with weather or environmental changes or other aggravating or alleviating factors except as outlined above   No unusual exposure hx or h/o childhood pna/ asthma or knowledge of premature birth.  Current Allergies, Complete Past Medical History, Past Surgical History, Family History, and Social History were reviewed in [employer] record.  ROS  The following are not active complaints unless bolded Hoarseness, sore throat, dysphagia, dental problems, itching, sneezing,  nasal congestion or discharge of excess mucus or purulent secretions, ear ache,   fever, chills, sweats, unintended wt loss or wt gain, classically pleuritic or exertional cp,  orthopnea pnd or arm/hand swelling  or leg swelling, presyncope,  palpitations, abdominal pain, anorexia, nausea, vomiting, diarrhea  or change in bowel habits or change in bladder habits, change in stools or change in urine, dysuria, hematuria,  rash, arthralgias, visual complaints, headache, numbness, weakness or ataxia or problems with walking or coordination,  change in mood or  memory.           Past Medical History:  Diagnosis Date  . IBS (irritable bowel syndrome)   . 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 Right sided facial pain     Outpatient Medications Prior to Visit  Medication Sig Dispense Refill  . Ascorbic Acid (VITAMIN C PO) Take by mouth daily.    Marland Kitchen b complex vitamins tablet Take 1 tablet by mouth daily.    Marland Kitchen BETAINE PO Take by mouth daily.    Marland Kitchen CALCIUM PO Take by mouth daily.    . COLLAGEN PO Take by mouth daily.    Emily Meyer ASPIRIN ADULT LOW DOSE 81 MG EC tablet Take by mouth.    . Flaxseed, Linseed, (EQL FLAX SEED OIL) 1000 MG CAPS Take 1,000 mg by mouth 2 (two) times daily.    . Iodine, Kelp, (KELP PO) Take by mouth daily.    . IRON PO Take by mouth as needed.    . Magnesium Oxide (MAG-CAPS PO) Take 1 capsule by mouth daily.    . Zinc Sulfate (ZINC 15 PO) Take 1  tablet by mouth daily.     No facility-administered medications prior to visit.     Objective:     BP 118/70 (BP Location: Left Arm, Cuff Size: Normal)   Pulse 83   Temp (!) 97.2 F (36.2 C)   Ht 5\' 5"  (1.651 m)   Wt 170 lb (77.1 kg)   LMP 07/21/2020   SpO2 97% Comment: RA  BMI 28.29 kg/m   SpO2: 97 % (RA)     HEENT : pt wearing mask not removed for exam due to covid -19 concerns.    NECK :  without JVD/Nodes/TM/ nl carotid upstrokes bilaterally   LUNGS: no acc muscle use,  Nl contour chest which is clear to A and P bilaterally without cough on insp or exp maneuvers   CV:  RRR  no s3 or murmur or increase in P2, and no edema   ABD:  soft and nontender with nl inspiratory excursion in the supine position. No bruits or  organomegaly appreciated, bowel sounds nl  MS:  Nl gait/ ext warm without deformities, calf tenderness, cyanosis or clubbing No obvious joint restrictions   SKIN: warm and dry without lesions    NEURO:  alert, approp, nl sensorium with  no motor or cerebellar deficits apparent.    I personally reviewed images and agree with radiology impression as follows:  CXR:   Pa and lateral 07/30/20  1. Unresolved 14 mm spiculated left upper lung nodule. While this could be postinflammatory, if it persists on additional chest x-ray follow-up in another 3-4 weeks then further characterization with Chest CT would be recommended (noncontrast CT should suffice). 2. Bilateral lower lung bronchopneumonia appears resolved   I personally reviewed images and agree with radiology impression as follows:   Chest CTa 08/14/2020  1. Scattered peripheral-predominant ground-glass pulmonary opacities favored to represent sequela of prior COVID 19 associated pneumonia. Persistent versus developing multifocal pneumonia could appear similarly. Recommend clinical correlation. 2.  Emphysema (ICD10-J43.9).  Labs  reviewed:      Chemistry      Component Value Date/Time   NA 139 06/29/2020 1518   K 5.4 (H) 06/29/2020 1518   CL 103 06/29/2020 1518   CO2 16 (L) 06/29/2020 1518   BUN 17 06/29/2020 1518   CREATININE 0.70 06/29/2020 1518   GLU 81 11/28/2014 0000      Component Value Date/Time   CALCIUM 9.0 06/29/2020 1518   ALKPHOS 62 06/29/2020 1518   AST 21 06/29/2020 1518   ALT 17 06/29/2020 1518   BILITOT 0.2 06/29/2020 1518        Lab Results  Component Value Date   WBC 7.5 06/29/2020   HGB 12.5 06/29/2020   HCT 38.2 06/29/2020   MCV 90 06/29/2020   PLT 425 06/29/2020     Lab Results  Component Value Date   DDIMER 1.57 (H) 06/29/2020      Lab Results  Component Value Date   TSH 1.100 08/10/2020      BNP     08/10/20  9.1             Assessment   No problem-specific Assessment  & Plan notes found for this encounter.     Emily Gully, MD 08/13/2020

## 2020-08-13 NOTE — Patient Instructions (Signed)
To get the most out of exercise, you need to be continuously aware that you are short of breath, but never out of breath, for 30 minutes daily. As you improve, it will actually be easier for you to do the same amount of exercise  in  30 minutes so always push to the level where you are short of breath.    Make sure you check your oxygen saturations at highest level of activity to be sure it stays over 90% and keep track of it at least once a week, more often if breathing getting worse, and let me know if losing ground.   I will review your CT chest and call you with the results   Pulmonary follow is as needed

## 2020-08-14 ENCOUNTER — Ambulatory Visit
Admission: RE | Admit: 2020-08-14 | Discharge: 2020-08-14 | Disposition: A | Discharge status: Home / Self Care | Payer: BLUE CROSS/BLUE SHIELD | Source: Ambulatory Visit | Attending: Cardiovascular Disease | Admitting: Cardiovascular Disease

## 2020-08-14 ENCOUNTER — Encounter: Payer: Self-pay | Admitting: Internal Medicine

## 2020-08-14 DIAGNOSIS — R0602 Shortness of breath: Secondary | ICD-10-CM | POA: Diagnosis not present

## 2020-08-14 DIAGNOSIS — U071 COVID-19: Secondary | ICD-10-CM | POA: Insufficient documentation

## 2020-08-14 DIAGNOSIS — J1282 Pneumonia due to coronavirus disease 2019: Secondary | ICD-10-CM | POA: Insufficient documentation

## 2020-08-14 DIAGNOSIS — R079 Chest pain, unspecified: Secondary | ICD-10-CM

## 2020-08-14 MED ORDER — IOPAMIDOL (ISOVUE-370) INJECTION 76%
75.0000 mL | Freq: Once | INTRAVENOUS | Status: AC | PRN
  Administered 2020-08-14: 75 mL via INTRAVENOUS

## 2020-08-14 NOTE — Assessment & Plan Note (Signed)
Onset of symptoms Jun 16 2020  -  08/13/2020   Walked RA  3 laps @ approx 214ft each @ brisk pace  stopped due to end of study, no sob with sats 98% at end  - CTa 08/14/2020 >>>  Chest CTa 08/14/2020  1. Scattered peripheral-predominant ground-glass pulmonary opacities favored to represent sequela of prior COVID 19 associated pneumonia. Persistent versus developing multifocal pneumonia could appear similarly. Recommend clinical correlation. 2.  Emphysema (ICD10-J43.9).  All symptoms and findings typical for covid x for the finding of mild emphysema which is probably not of clinical significance as long as her ex tol return to baseline and sats stay the same or improve over time  rec regular sub max ex and f/u if not 100%  Better in 6 weeks.           Each maintenance medication was reviewed in detail including emphasizing most importantly the difference between maintenance and prns and under what circumstances the prns are to be triggered using an action plan format where appropriate.  Total time for H and P, chart review, counseling, teaching device and generating customized AVS unique to this office visit / charting = 45 min

## 2020-09-01 ENCOUNTER — Other Ambulatory Visit: Payer: Self-pay

## 2020-09-01 ENCOUNTER — Ambulatory Visit (HOSPITAL_COMMUNITY): Payer: BLUE CROSS/BLUE SHIELD | Attending: Cardiovascular Disease

## 2020-09-01 DIAGNOSIS — R0602 Shortness of breath: Secondary | ICD-10-CM | POA: Diagnosis not present

## 2020-09-01 DIAGNOSIS — R079 Chest pain, unspecified: Secondary | ICD-10-CM | POA: Diagnosis not present

## 2020-09-01 DIAGNOSIS — R002 Palpitations: Secondary | ICD-10-CM | POA: Diagnosis not present

## 2020-09-01 LAB — ECHOCARDIOGRAM COMPLETE
Area-P 1/2: 2.64 cm2
S' Lateral: 2.8 cm

## 2020-09-19 ENCOUNTER — Ambulatory Visit (INDEPENDENT_AMBULATORY_CARE_PROVIDER_SITE_OTHER): Payer: BLUE CROSS/BLUE SHIELD

## 2020-09-19 DIAGNOSIS — R079 Chest pain, unspecified: Secondary | ICD-10-CM | POA: Diagnosis not present

## 2020-09-19 DIAGNOSIS — R002 Palpitations: Secondary | ICD-10-CM

## 2020-09-19 DIAGNOSIS — R0602 Shortness of breath: Secondary | ICD-10-CM | POA: Diagnosis not present

## 2020-10-08 DIAGNOSIS — R079 Chest pain, unspecified: Secondary | ICD-10-CM | POA: Diagnosis not present

## 2020-10-08 DIAGNOSIS — R002 Palpitations: Secondary | ICD-10-CM | POA: Diagnosis not present

## 2020-10-08 DIAGNOSIS — R0602 Shortness of breath: Secondary | ICD-10-CM | POA: Diagnosis not present

## 2020-10-15 ENCOUNTER — Other Ambulatory Visit: Payer: Self-pay

## 2020-10-15 ENCOUNTER — Other Ambulatory Visit (HOSPITAL_COMMUNITY): Payer: Self-pay | Admitting: Orthopedic Surgery

## 2020-10-15 ENCOUNTER — Ambulatory Visit (HOSPITAL_COMMUNITY)
Admission: RE | Admit: 2020-10-15 | Discharge: 2020-10-15 | Disposition: A | Discharge status: Home / Self Care | Payer: BLUE CROSS/BLUE SHIELD | Source: Ambulatory Visit | Attending: Orthopedic Surgery | Admitting: Orthopedic Surgery

## 2020-10-15 DIAGNOSIS — Z96641 Presence of right artificial hip joint: Secondary | ICD-10-CM

## 2020-11-03 DIAGNOSIS — M1612 Unilateral primary osteoarthritis, left hip: Secondary | ICD-10-CM | POA: Diagnosis not present

## 2020-11-03 DIAGNOSIS — Z96641 Presence of right artificial hip joint: Secondary | ICD-10-CM | POA: Diagnosis not present

## 2020-11-12 NOTE — Progress Notes (Signed)
Cardiology Office Note:   Date:  11/13/2020  NAME:  Emily Meyer    MRN: [id number] DOB:  03-27-1966   PCP:  Inda Coke, PA  Cardiologist:  Evalina Field, MD  Electrophysiologist:  None   Referring MD: Inda Coke, PA   Chief Complaint  Patient presents with  . Follow-up   History of Present Illness:   Emily Meyer is a 55 y.o. female with a hx of COVID-19 pneumonia who presents for follow-up of shortness of breath and palpitations.  Recent echocardiogram normal.  Recent heart monitor without arrhythmias.  A CT PE study was ordered that demonstrated no pulmonary embolism.  She did have groundglass opacities consistent with COVID-19 pneumonia.  She reports her symptoms have improved.  She is no longer short of breath and having palpitations.  She reports she can use an elliptical for 30 minutes without any major issues.  She reports she can easily exercise 2 miles without any shortness of breath or chest pain.  She describes no further palpitations.  She does describe infrequent indigestion that is relieved by burping.  Overall appears to be doing much better.  She is over her COVID-19 illness.  I did review her laboratory data with her from her primary care which shows exceedingly high HDL cholesterol.  She overall seems to be within good health.  She takes no medications regularly.  Blood pressure today in office 105/63.  Oxygen saturations 100%.  Past Medical History: Past Medical History:  Diagnosis Date  . IBS (irritable bowel syndrome)   . 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 Right sided facial pain     Past Surgical History: Past Surgical History:  Procedure Laterality Date  . TOTAL HIP ARTHROPLASTY      Current Medications: Current Meds  Medication Sig  . Ascorbic Acid (VITAMIN C PO) Take by mouth daily.  Marland Kitchen b complex vitamins tablet Take 1 tablet by mouth daily.  Marland Kitchen BETAINE PO Take by mouth daily.  Marland Kitchen CALCIUM PO Take by mouth  daily.  . COLLAGEN PO Take by mouth daily.  . Flaxseed, Linseed, (EQL FLAX SEED OIL) 1000 MG CAPS Take 1,000 mg by mouth 2 (two) times daily.  . Iodine, Kelp, (KELP PO) Take by mouth daily.  . IRON PO Take by mouth as needed.  . Magnesium Oxide (MAG-CAPS PO) Take 1 capsule by mouth daily.  . Zinc Sulfate (ZINC 15 PO) Take 1 tablet by mouth daily.  . [DISCONTINUED] EQ ASPIRIN ADULT LOW DOSE 81 MG EC tablet Take by mouth.     Allergies:    Adhesive [tape]   Social History: Social History   Socioeconomic History  . Marital status: Divorced    Spouse name: Not on file  . Number of children: 2  . Years of education: 61  . Highest education level: Associate degree: occupational, Hotel manager, or vocational program  Occupational History  . Occupation: Therapist, art Rep    Employer: [employer]  Tobacco Use  . Smoking status: Never Smoker  . Smokeless tobacco: Never Used  Vaping Use  . Vaping Use: Never used  Substance and Sexual Activity  . Alcohol use: No  . Drug use: No  . Sexual activity: Never  Other Topics Concern  . Not on file  Social History Narrative   Divorced in 2018   Older daughter lives with her, currently unemployed   Lives with younger daughter   Education officer, community   Social Determinants  of Health   Financial Resource Strain: Not on file  Food Insecurity: Not on file  Transportation Needs: Not on file  Physical Activity: Not on file  Stress: Not on file  Social Connections: Not on file     Family History: The patient's family history includes AAA (abdominal aortic aneurysm) in her father; Breast cancer in her paternal aunt; Diabetes in her brother; Healthy in her mother; Hypertension in her father. There is no history of Colon cancer.  ROS:   All other ROS reviewed and negative. Pertinent positives noted in the HPI.     EKGs/Labs/Other Studies Reviewed:   The following studies were personally reviewed by me today:  TTE  09/01/2020 1. Left ventricular ejection fraction, by estimation, is 60 to 65%. The  left ventricle has normal function. The left ventricle has no regional  wall motion abnormalities. Left ventricular diastolic parameters were  normal.  2. Right ventricular systolic function is normal. The right ventricular  size is normal. There is normal pulmonary artery systolic pressure.  3. The mitral valve is normal in structure. Trivial mitral valve  regurgitation. No evidence of mitral stenosis.  4. The aortic valve is normal in structure. Aortic valve regurgitation is  not visualized. No aortic stenosis is present.  5. The inferior vena cava is normal in size with greater than 50%  respiratory variability, suggesting right atrial pressure of 3 mmHg.   [place] 10/19/2020  1. No significant arrhythmias detected. 2. Rare ectopy.   Recent Labs: 06/29/2020: ALT 17; BUN 17; Creatinine, Ser 0.70; Hemoglobin 12.5; Platelets 425; Potassium 5.4; Sodium 139 08/10/2020: BNP 9.1; TSH 1.100   Recent Lipid Panel    Component Value Date/Time   CHOL 214 (H) 12/31/2019 0917   TRIG 67.0 12/31/2019 0917   HDL 79.30 12/31/2019 0917   CHOLHDL 3 12/31/2019 0917   VLDL 13.4 12/31/2019 0917   LDLCALC 121 (H) 12/31/2019 0917    Physical Exam:   VS:  BP 105/63   Pulse 77   Ht 5\' 6"  (1.676 m)   Wt 178 lb 3.2 oz (80.8 kg)   SpO2 100%   BMI 28.76 kg/m    Wt Readings from Last 3 Encounters:  11/13/20 178 lb 3.2 oz (80.8 kg)  08/13/20 170 lb (77.1 kg)  08/10/20 169 lb 12.8 oz (77 kg)    General: Well nourished, well developed, in no acute distress Head: Atraumatic, normal size  Eyes: PEERLA, EOMI  Neck: Supple, no JVD Endocrine: No thryomegaly Cardiac: Normal S1, S2; RRR; no murmurs, rubs, or gallops Lungs: Clear to auscultation bilaterally, no wheezing, rhonchi or rales  Abd: Soft, nontender, no hepatomegaly  Ext: No edema, pulses 2+ Musculoskeletal: No deformities, BUE and BLE strength normal and  equal Skin: Warm and dry, no rashes   Neuro: Alert and oriented to person, place, time, and situation, CNII-XII grossly intact, no focal deficits  Psych: Normal mood and affect   ASSESSMENT:   Emily Meyer is a 55 y.o. female who presents for the following: 1. SOB (shortness of breath) on exertion   2. Palpitations     PLAN:   1. SOB (shortness of breath) on exertion 2. Palpitations -BNP was normal.  Echocardiogram normal.  She had a CT PE study that showed no evidence of pulmonary embolism.  CT scan the lung fields show COVID-19 pneumonia.  Suspect her symptoms were all related to this.  Her symptoms have improved.  Her monitor was normal.  She had no arrhythmias.  I suspect  this was all COVID-19 related.  No need to see [place] moving forward.  Her heart is quite healthy.  Disposition: Return if symptoms worsen or fail to improve.  Medication Adjustments/Labs and Tests Ordered: Current medicines are reviewed at length with the patient today.  Concerns regarding medicines are outlined above.  No orders of the defined types were placed in this encounter.  No orders of the defined types were placed in this encounter.   Patient Instructions  Medication Instructions:  The current medical regimen is effective;  continue present plan and medications.  *If you need a refill on your cardiac medications before your next appointment, please call your pharmacy*  Follow-Up: At [facility], you and your health needs are our priority.  As part of our continuing mission to provide you with exceptional heart care, we have created designated Provider Care Teams.  These Care Teams include your primary Cardiologist (physician) and Advanced Practice Providers (APPs -  Physician Assistants and Nurse Practitioners) who all work together to provide you with the care you need, when you need it.  We recommend signing up for the patient portal called "MyChart".  Sign up information is provided on this After  Visit Summary.  MyChart is used to connect with patients for Virtual Visits (Telemedicine).  Patients are able to view lab/test results, encounter notes, upcoming appointments, etc.  Non-urgent messages can be sent to your provider as well.   To learn more about what you can do with MyChart, go to [email or web address].    Your next appointment:   As needed  The format for your next appointment:   In Person  Provider:   Eleonore Chiquito, MD       Time Spent with Patient: I have spent a total of 25 minutes with patient reviewing hospital notes, telemetry, EKGs, labs and examining the patient as well as establishing an assessment and plan that was discussed with the patient.  > 50% of time was spent in direct patient care.  Signed, Addison Naegeli. Audie Box, [place]  [address], [place], [place] [address] [phone]  11/13/2020 4:28 PM

## 2020-11-13 ENCOUNTER — Other Ambulatory Visit: Payer: Self-pay

## 2020-11-13 ENCOUNTER — Ambulatory Visit: Payer: BLUE CROSS/BLUE SHIELD | Admitting: Cardiovascular Disease

## 2020-11-13 ENCOUNTER — Encounter: Payer: Self-pay | Admitting: Cardiovascular Disease

## 2020-11-13 VITALS — BP 105/63 | HR 77 | Ht 66.0 in | Wt 178.2 lb

## 2020-11-13 DIAGNOSIS — R002 Palpitations: Secondary | ICD-10-CM

## 2020-11-13 DIAGNOSIS — R0602 Shortness of breath: Secondary | ICD-10-CM

## 2020-11-13 NOTE — Patient Instructions (Signed)
Medication Instructions:  The current medical regimen is effective;  continue present plan and medications.  *If you need a refill on your cardiac medications before your next appointment, please call your pharmacy*    Follow-Up: At [facility], you and your health needs are our priority.  As part of our continuing mission to provide you with exceptional heart care, we have created designated Provider Care Teams.  These Care Teams include your primary Cardiologist (physician) and Advanced Practice Providers (APPs -  Physician Assistants and Nurse Practitioners) who all work together to provide you with the care you need, when you need it.  We recommend signing up for the patient portal called "MyChart".  Sign up information is provided on this After Visit Summary.  MyChart is used to connect with patients for Virtual Visits (Telemedicine).  Patients are able to view lab/test results, encounter notes, upcoming appointments, etc.  Non-urgent messages can be sent to your provider as well.   To learn more about what you can do with MyChart, go to [email or web address].    Your next appointment:   As needed  The format for your next appointment:   In Person  Provider:   Jalapa O'Neal, MD      

## 2021-01-26 ENCOUNTER — Other Ambulatory Visit: Payer: Self-pay

## 2021-01-26 ENCOUNTER — Ambulatory Visit (INDEPENDENT_AMBULATORY_CARE_PROVIDER_SITE_OTHER): Payer: BC Managed Care – PPO | Admitting: Physician Assistant

## 2021-01-26 ENCOUNTER — Encounter: Payer: Self-pay | Admitting: Physician Assistant

## 2021-01-26 VITALS — BP 118/70 | HR 81 | Temp 98.1°F | Ht 66.0 in | Wt 172.0 lb

## 2021-01-26 DIAGNOSIS — Z01818 Encounter for other preprocedural examination: Secondary | ICD-10-CM

## 2021-01-26 LAB — CBC WITH DIFFERENTIAL/PLATELET
Basophils Absolute: 0 10*3/uL (ref 0.0–0.1)
Basophils Relative: 0.7 % (ref 0.0–3.0)
Eosinophils Absolute: 0 10*3/uL (ref 0.0–0.7)
Eosinophils Relative: 0.8 % (ref 0.0–5.0)
HCT: 40.6 % (ref 36.0–46.0)
Hemoglobin: 13.5 g/dL (ref 12.0–15.0)
Lymphocytes Relative: 25 % (ref 12.0–46.0)
Lymphs Abs: 1.5 10*3/uL (ref 0.7–4.0)
MCHC: 33.3 g/dL (ref 30.0–36.0)
MCV: 90.1 fl (ref 78.0–100.0)
Monocytes Absolute: 0.5 10*3/uL (ref 0.1–1.0)
Monocytes Relative: 7.8 % (ref 3.0–12.0)
Neutro Abs: 3.9 10*3/uL (ref 1.4–7.7)
Neutrophils Relative %: 65.7 % (ref 43.0–77.0)
Platelets: 214 10*3/uL (ref 150.0–400.0)
RBC: 4.5 Mil/uL (ref 3.87–5.11)
RDW: 13.9 % (ref 11.5–15.5)
WBC: 5.9 10*3/uL (ref 4.0–10.5)

## 2021-01-26 LAB — COMPREHENSIVE METABOLIC PANEL
ALT: 16 U/L (ref 0–35)
AST: 15 U/L (ref 0–37)
Albumin: 4.1 g/dL (ref 3.5–5.2)
Alkaline Phosphatase: 40 U/L (ref 39–117)
BUN: 18 mg/dL (ref 6–23)
CO2: 28 mEq/L (ref 19–32)
Calcium: 9 mg/dL (ref 8.4–10.5)
Chloride: 104 mEq/L (ref 96–112)
Creatinine, Ser: 0.74 mg/dL (ref 0.40–1.20)
GFR: 90.8 mL/min (ref >60.00)
Glucose, Bld: 93 mg/dL (ref 70–99)
Potassium: 4.2 mEq/L (ref 3.5–5.1)
Sodium: 138 mEq/L (ref 135–145)
Total Bilirubin: 1.1 mg/dL (ref 0.2–1.2)
Total Protein: 6.8 g/dL (ref 6.0–8.3)

## 2021-01-26 LAB — HEMOGLOBIN A1C: Hgb A1c MFr Bld: 6 % (ref 4.6–6.5)

## 2021-01-26 NOTE — Patient Instructions (Signed)
It was great to see you!  We will update your blood work today and sign off on your clearance form after labs have returned.  Best of luck for your surgery.  Take care,  Inda Coke PA-C

## 2021-01-26 NOTE — Progress Notes (Signed)
Emily Meyer is a 55 y.o. female is here for surgical clearance.  I acted as a Education administrator for [employer], PA-C Emily Pickler, LPN   History of Present Illness:   Chief Complaint  Patient presents with  . pre-op clearance    HPI    Pre-op clearance Pt is here today for surgical clearance. She is scheduled for L anterior hip arthroplasty under spinal anesthesia on 02/19/21.  She has seen cardiology as recently as Jan 2022. She was seeing them for post-COVID SOB. This has resolved. Notes reviewed from Emily Meyer. She was doing well at time of visit, exercising for 30 min at a time without chest pain or concerning SOB. She has no scheduled follow-up with him, just as needed. She is not on blood thinners and does not have DM. She reports that she continues to do well with this. Denies any current chest pain, SOB, lightheadedness.  She continues to exercise as able. She takes no prescription medications.  There are no preventive care reminders to display for this patient.  Past Medical History:  Diagnosis Date  . IBS (irritable bowel syndrome)   . 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 Right sided facial pain      Social History   Tobacco Use  . Smoking status: Never Smoker  . Smokeless tobacco: Never Used  Vaping Use  . Vaping Use: Never used  Substance Use Topics  . Alcohol use: No  . Drug use: No    Past Surgical History:  Procedure Laterality Date  . TOTAL HIP ARTHROPLASTY      Family History  Problem Relation Age of Onset  . Healthy Mother   . Hypertension Father   . AAA (abdominal aortic aneurysm) Father   . Diabetes Brother   . Breast cancer Paternal Aunt   . Colon cancer Neg Hx     PMHx, SurgHx, SocialHx, FamHx, Medications, and Allergies were reviewed in the Visit Navigator and updated as appropriate.   Patient Active Problem List   Diagnosis Date Noted  . Pneumonia due to COVID-19 virus 08/14/2020  . Vitamin D  deficiency 06/22/2018  . Chronic pansinusitis 06/15/2017  . Pharyngoesophageal dysphagia 06/15/2017  . 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 Smoking status: Never Smoker  . Smokeless tobacco: Never Used  Vaping Use  . Vaping Use: Never used  Substance Use Topics  . Alcohol use: No  . Drug use: No    Current Medications and Allergies:    Current Outpatient Medications:  .  Ascorbic Acid (VITAMIN C PO), Take by mouth daily., Disp: , Rfl:  .  b complex vitamins tablet, Take 1 tablet by mouth daily., Disp: , Rfl:  .  BETAINE PO, Take by mouth daily., Disp: , Rfl:  .  CALCIUM PO, Take by mouth daily., Disp: , Rfl:  .  COLLAGEN PO, Take by mouth daily., Disp: , Rfl:  .  Flaxseed, Linseed, (EQL FLAX SEED OIL) 1000 MG CAPS, Take 1,000 mg by mouth 2 (two) times daily., Disp: , Rfl:  .  Iodine, Kelp, (KELP PO), Take by mouth daily., Disp: , Rfl:  .  IRON PO, Take by mouth as needed., Disp: , Rfl:  .  Magnesium Oxide (MAG-CAPS PO), Take 1 capsule by mouth daily., Disp: , Rfl:  .  Zinc Sulfate (ZINC 15 PO), Take 1 tablet by mouth daily., Disp: , Rfl:    Allergies  Allergen  Reactions  . Adhesive [Tape]     rash    Review of Systems   ROS Negative unless otherwise specified per HPI.  Vitals:   Vitals:   01/26/21 0943  BP: 118/70  Pulse: 81  Temp: 98.1 F (36.7 C)  TempSrc: Temporal  SpO2: 98%  Weight: 172 lb (78 kg)  Height: 5\' 6"  (1.676 m)     Body mass index is 27.76 kg/m.   Physical Exam:    Physical Exam Vitals and nursing note reviewed.  Constitutional:      General: She is not in acute distress.    Appearance: She is well-developed. She is not ill-appearing or toxic-appearing.  Cardiovascular:     Rate and Rhythm: Normal rate and regular rhythm.     Pulses: Normal pulses.     Heart sounds: Normal heart sounds, S1 normal and S2 normal.     Comments: No LE edema Pulmonary:     Effort: Pulmonary effort is normal.     Breath  sounds: Normal breath sounds.  Skin:    General: Skin is warm and dry.  Neurological:     Mental Status: She is alert.     GCS: GCS eye subscore is 4. GCS verbal subscore is 5. GCS motor subscore is 6.  Psychiatric:        Speech: Speech normal.        Behavior: Behavior normal. Behavior is cooperative.      Assessment and Plan:    [name] was seen today for pre-op clearance.  Diagnoses and all orders for this visit:  Pre-operative clearance -     CBC with Differential/Platelet -     Comprehensive metabolic panel -     Hemoglobin A1c   Patient is without any concerning symptoms at time of my visit today. Cardiology notes reviewed. She is in overall good health. Update blood work today. It is my opinion that she is a low-risk surgical candidate, pending normal labs.  CMA or LPN served as scribe during this visit. History, Physical, and Plan performed by medical provider. The above documentation has been reviewed and is accurate and complete.  Emily Coke, PA-C [place] [place] 01/26/2021  Follow-up: No follow-ups on file.

## 2021-02-01 ENCOUNTER — Telehealth: Payer: Self-pay

## 2021-02-01 NOTE — Telephone Encounter (Signed)
Emily Meyer is calling in wondering if we have received the original lab order for PTINR, as they did not receive the results with the surgical clearance form, can re-fax it if need to.

## 2021-02-02 ENCOUNTER — Other Ambulatory Visit: Payer: Self-pay

## 2021-02-02 ENCOUNTER — Other Ambulatory Visit: Payer: Self-pay | Admitting: *Deleted

## 2021-02-02 ENCOUNTER — Other Ambulatory Visit (INDEPENDENT_AMBULATORY_CARE_PROVIDER_SITE_OTHER): Payer: BC Managed Care – PPO

## 2021-02-02 DIAGNOSIS — Z01818 Encounter for other preprocedural examination: Secondary | ICD-10-CM

## 2021-02-02 LAB — PROTIME-INR
INR: 1 ratio (ref 0.8–1.0)
Prothrombin Time: 11.1 s (ref 9.6–13.1)

## 2021-02-02 NOTE — Progress Notes (Signed)
PT

## 2021-02-02 NOTE — Telephone Encounter (Signed)
Labs have returned, faxed to Marlin Canary at [facility].

## 2021-02-02 NOTE — Telephone Encounter (Signed)
Left message on personal voicemail please call the office as soon as you can need some additional lab work.

## 2021-02-02 NOTE — Telephone Encounter (Signed)
Called Emily Meyer and left detailed message on personal voicemail that PT/INR was missed. Pt is coming in today to have labs done. Will fax results over as soon as they are back.

## 2021-02-02 NOTE — Telephone Encounter (Signed)
Pt called back told her need additional blood work that was missed for Pre-op need lab appt only. Pt verbalized understanding. Appt scheduled for today.

## 2021-02-04 DIAGNOSIS — R531 Weakness: Secondary | ICD-10-CM | POA: Diagnosis not present

## 2021-02-04 DIAGNOSIS — M1612 Unilateral primary osteoarthritis, left hip: Secondary | ICD-10-CM | POA: Diagnosis not present

## 2021-02-04 DIAGNOSIS — R269 Unspecified abnormalities of gait and mobility: Secondary | ICD-10-CM | POA: Diagnosis not present

## 2021-02-10 DIAGNOSIS — M1612 Unilateral primary osteoarthritis, left hip: Secondary | ICD-10-CM | POA: Diagnosis not present

## 2021-02-19 DIAGNOSIS — M1612 Unilateral primary osteoarthritis, left hip: Secondary | ICD-10-CM | POA: Diagnosis not present

## 2021-02-25 DIAGNOSIS — M1612 Unilateral primary osteoarthritis, left hip: Secondary | ICD-10-CM | POA: Diagnosis not present

## 2021-03-02 DIAGNOSIS — Z9889 Other specified postprocedural states: Secondary | ICD-10-CM | POA: Diagnosis not present

## 2021-03-02 DIAGNOSIS — Z96642 Presence of left artificial hip joint: Secondary | ICD-10-CM | POA: Diagnosis not present

## 2021-03-23 DIAGNOSIS — M7052 Other bursitis of knee, left knee: Secondary | ICD-10-CM | POA: Diagnosis not present

## 2021-03-25 DIAGNOSIS — M25562 Pain in left knee: Secondary | ICD-10-CM | POA: Diagnosis not present

## 2021-05-18 DIAGNOSIS — Z96641 Presence of right artificial hip joint: Secondary | ICD-10-CM | POA: Diagnosis not present

## 2021-06-17 DIAGNOSIS — M79651 Pain in right thigh: Secondary | ICD-10-CM | POA: Diagnosis not present

## 2021-06-22 DIAGNOSIS — M25551 Pain in right hip: Secondary | ICD-10-CM | POA: Diagnosis not present

## 2021-06-22 DIAGNOSIS — Z96643 Presence of artificial hip joint, bilateral: Secondary | ICD-10-CM | POA: Diagnosis not present

## 2021-06-22 DIAGNOSIS — Z9889 Other specified postprocedural states: Secondary | ICD-10-CM | POA: Diagnosis not present

## 2021-12-21 DIAGNOSIS — L718 Other rosacea: Secondary | ICD-10-CM | POA: Diagnosis not present

## 2021-12-21 DIAGNOSIS — L570 Actinic keratosis: Secondary | ICD-10-CM | POA: Diagnosis not present

## 2021-12-21 DIAGNOSIS — L814 Other melanin hyperpigmentation: Secondary | ICD-10-CM | POA: Diagnosis not present

## 2021-12-21 DIAGNOSIS — D1801 Hemangioma of skin and subcutaneous tissue: Secondary | ICD-10-CM | POA: Diagnosis not present

## 2021-12-21 DIAGNOSIS — L821 Other seborrheic keratosis: Secondary | ICD-10-CM | POA: Diagnosis not present

## 2022-02-03 DIAGNOSIS — M7062 Trochanteric bursitis, left hip: Secondary | ICD-10-CM | POA: Diagnosis not present

## 2022-02-07 DIAGNOSIS — H10013 Acute follicular conjunctivitis, bilateral: Secondary | ICD-10-CM | POA: Diagnosis not present

## 2022-06-14 IMAGING — CR DG CHEST 2V
2 series · 2 of 2 positions shown · non-contrast
Comparison: 01/19/2006

CLINICAL DATA: Cough, shortness of breath for 3 weeks, pain with
inspiration

EXAM:
CHEST - 2 VIEW

[w chest pa]
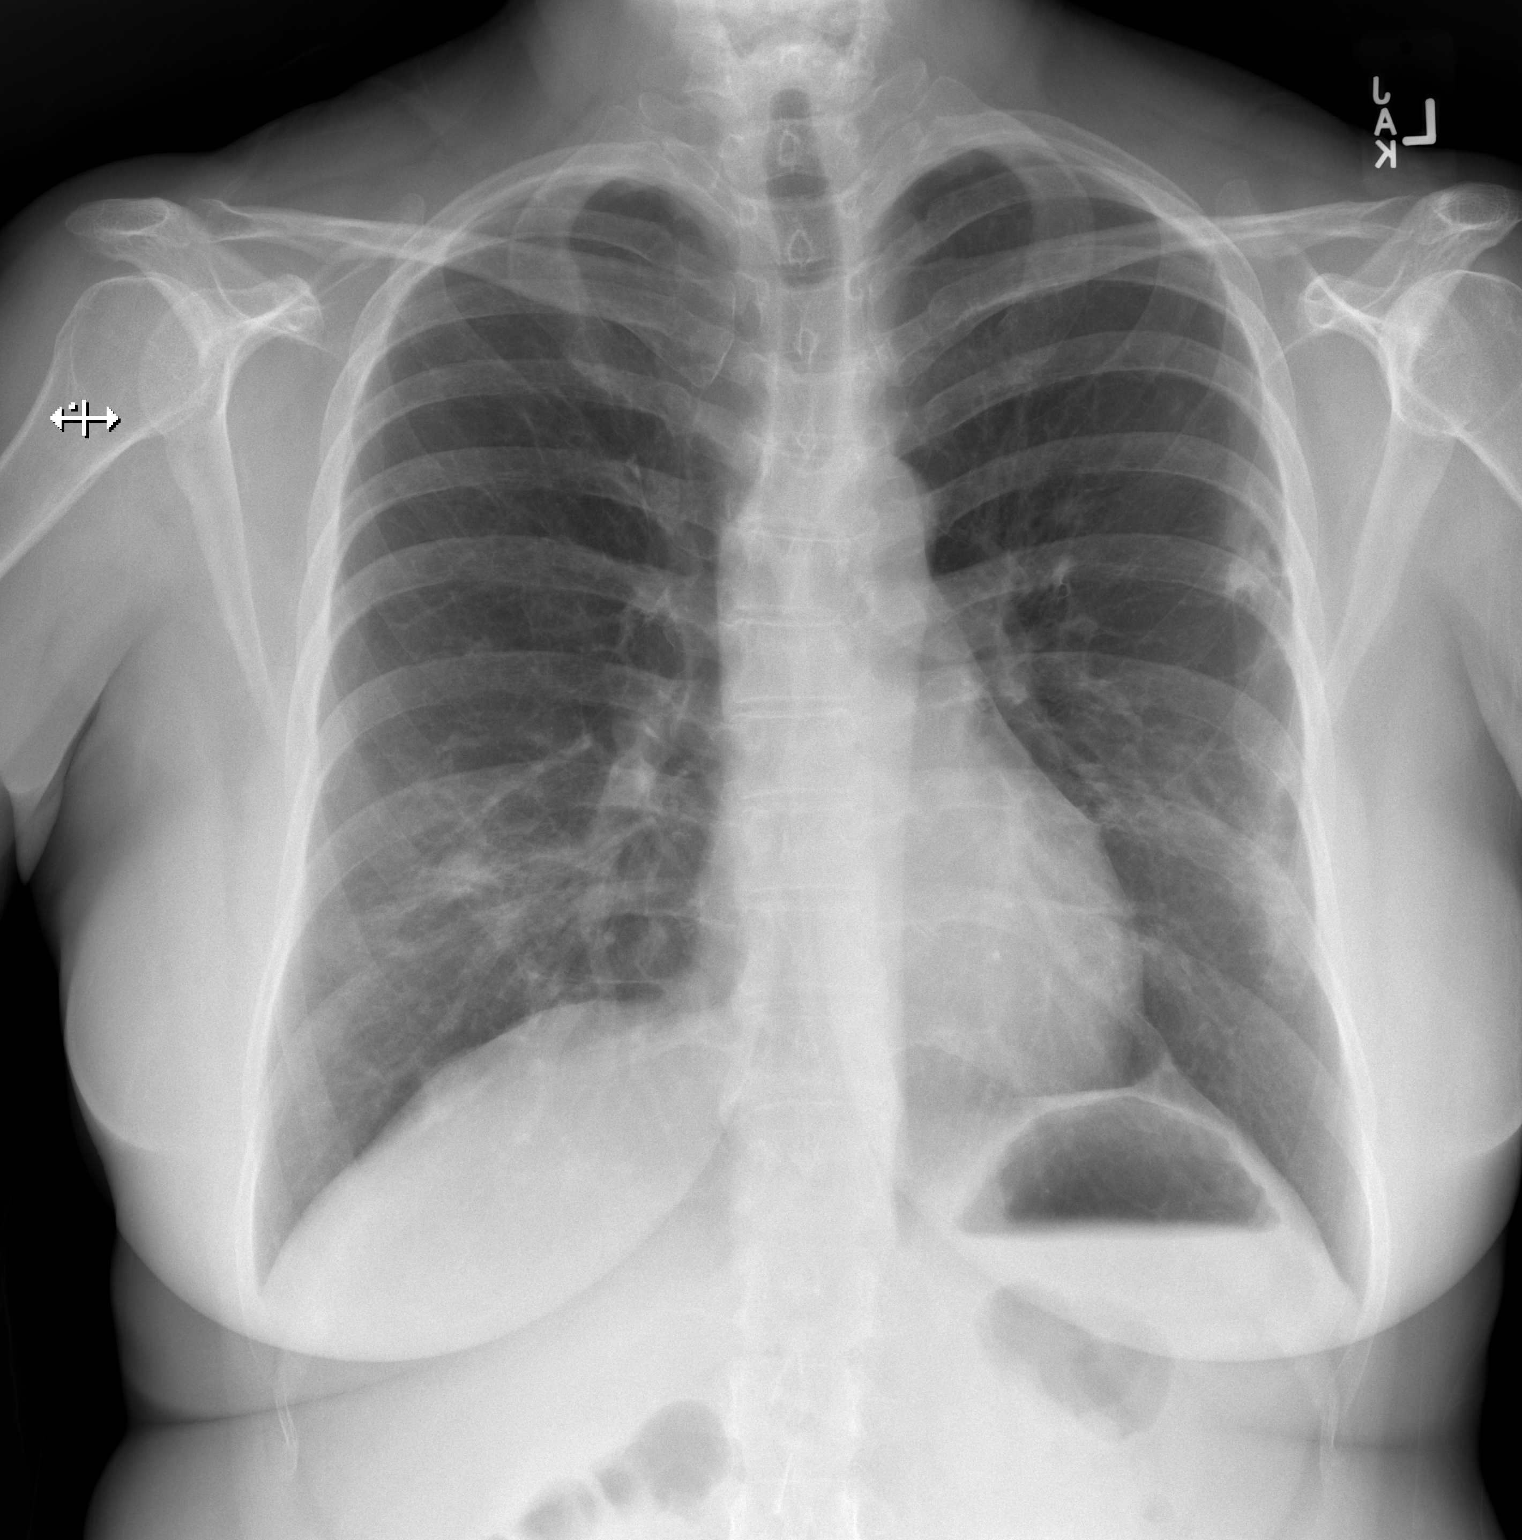

[w chest lat]
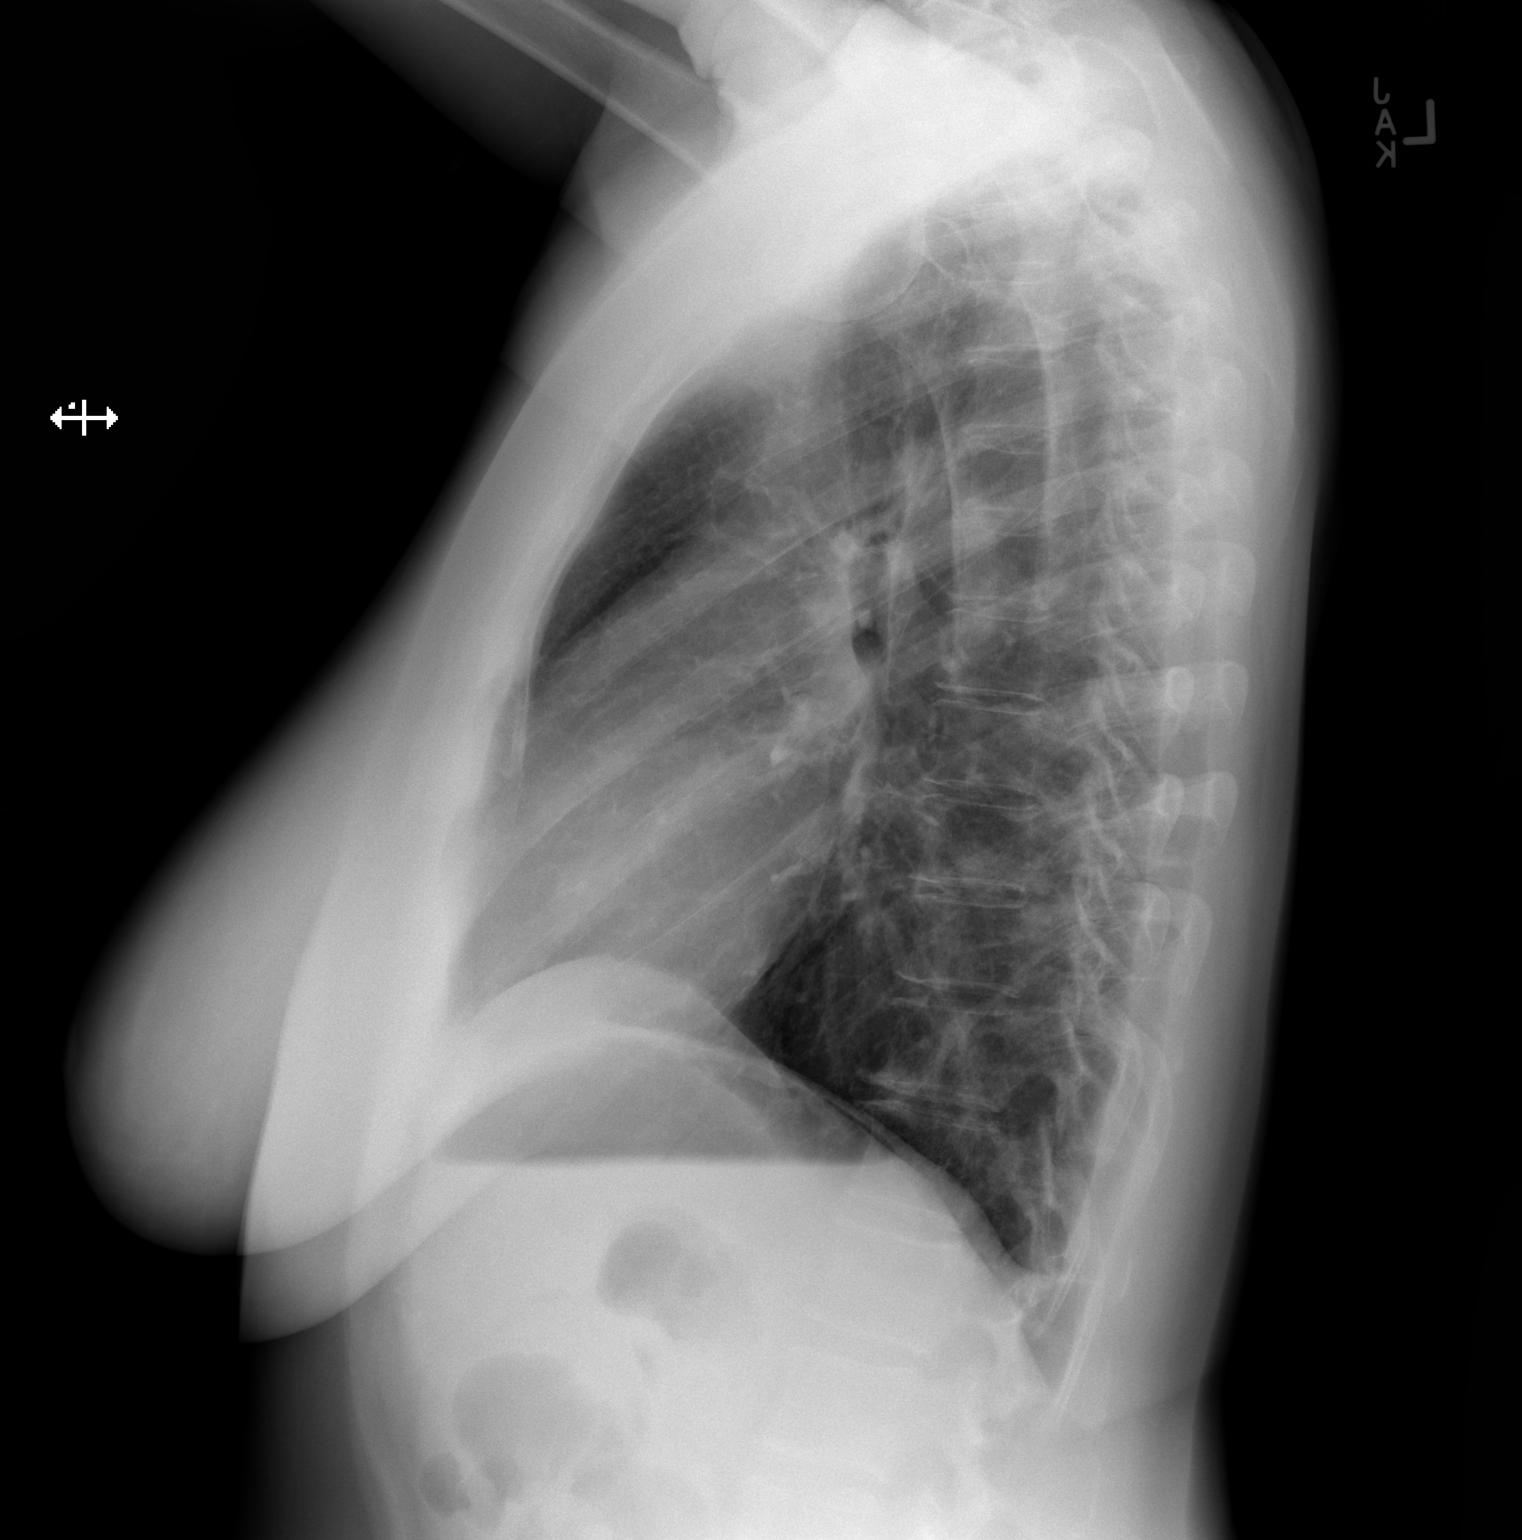

[2 of 2 positions shown; findings below may reference images not displayed]

FINDINGS: Frontal and lateral views of the chest demonstrate an unremarkable
cardiac silhouette. There are patchy bibasilar areas of
consolidation within the lower lobes. Additionally, nodular
opacities within the right lower lobe measuring 1 cm and left upper
lobe measuring 1.2 cm are noted. No effusion or pneumothorax. No
acute bony abnormalities.
IMPRESSION: 1. Bilateral lower lobe airspace disease and nodular opacities as
above. Given clinical history, multifocal pneumonia could give this
appearance. Followup PA and lateral chest X-ray is recommended in
3-4 weeks following trial of antibiotic therapy to ensure resolution
and exclude underlying malignancy. If there are no current signs or
symptoms of infection, CT scan could be considered.

These results will be called to the ordering clinician or
representative by the Radiologist Assistant, and communication
documented in the PACS or [REDACTED].

## 2022-07-13 IMAGING — CR DG CHEST 2V
2 series · 2 of 2 positions shown · non-contrast
Comparison: Chest radiographs 07/01/2020 and earlier.

CLINICAL DATA: 54-year-old female with suspected bilateral
pneumonia last month. Persistent cough.

EXAM:
CHEST - 2 VIEW

[w chest pa]
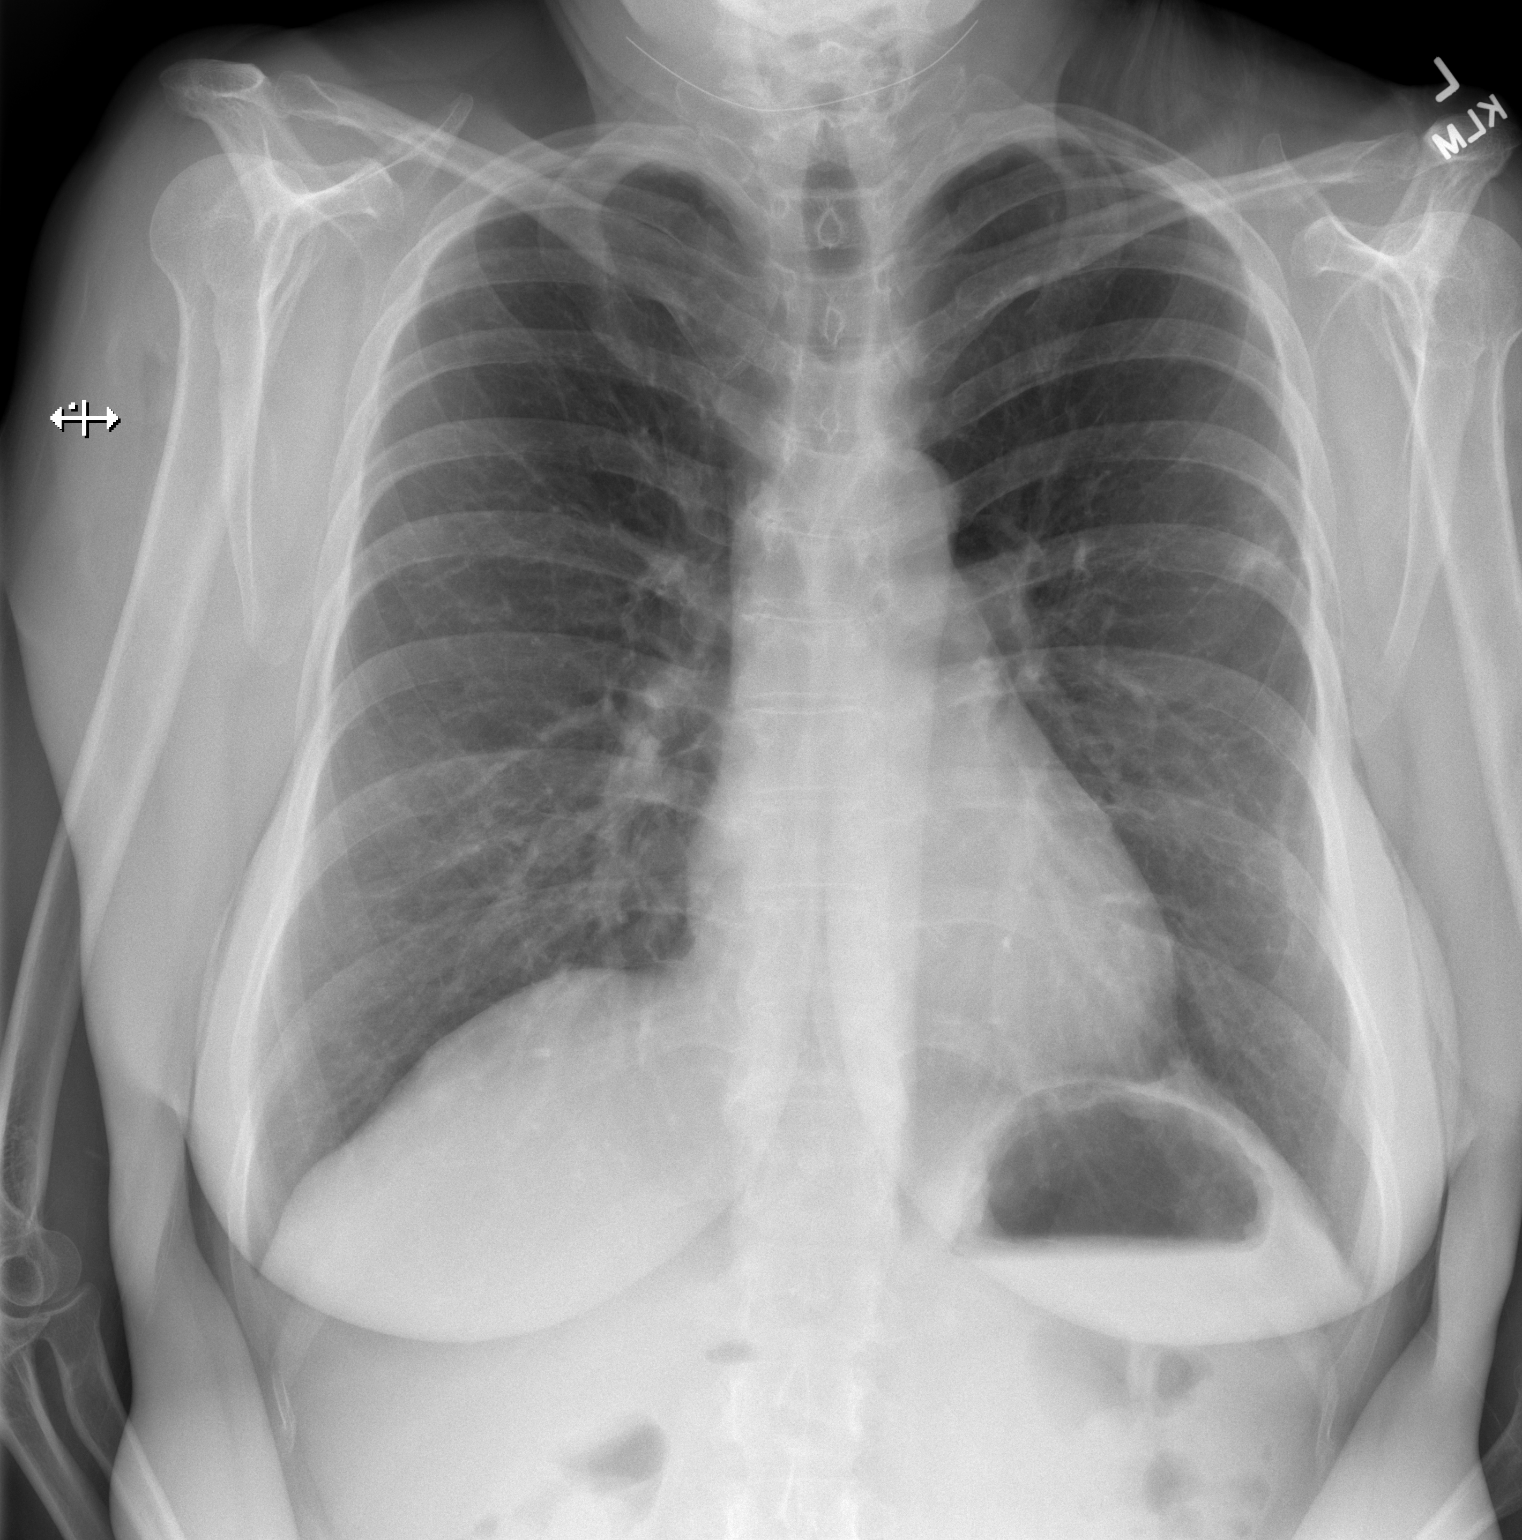

[w chest lat]
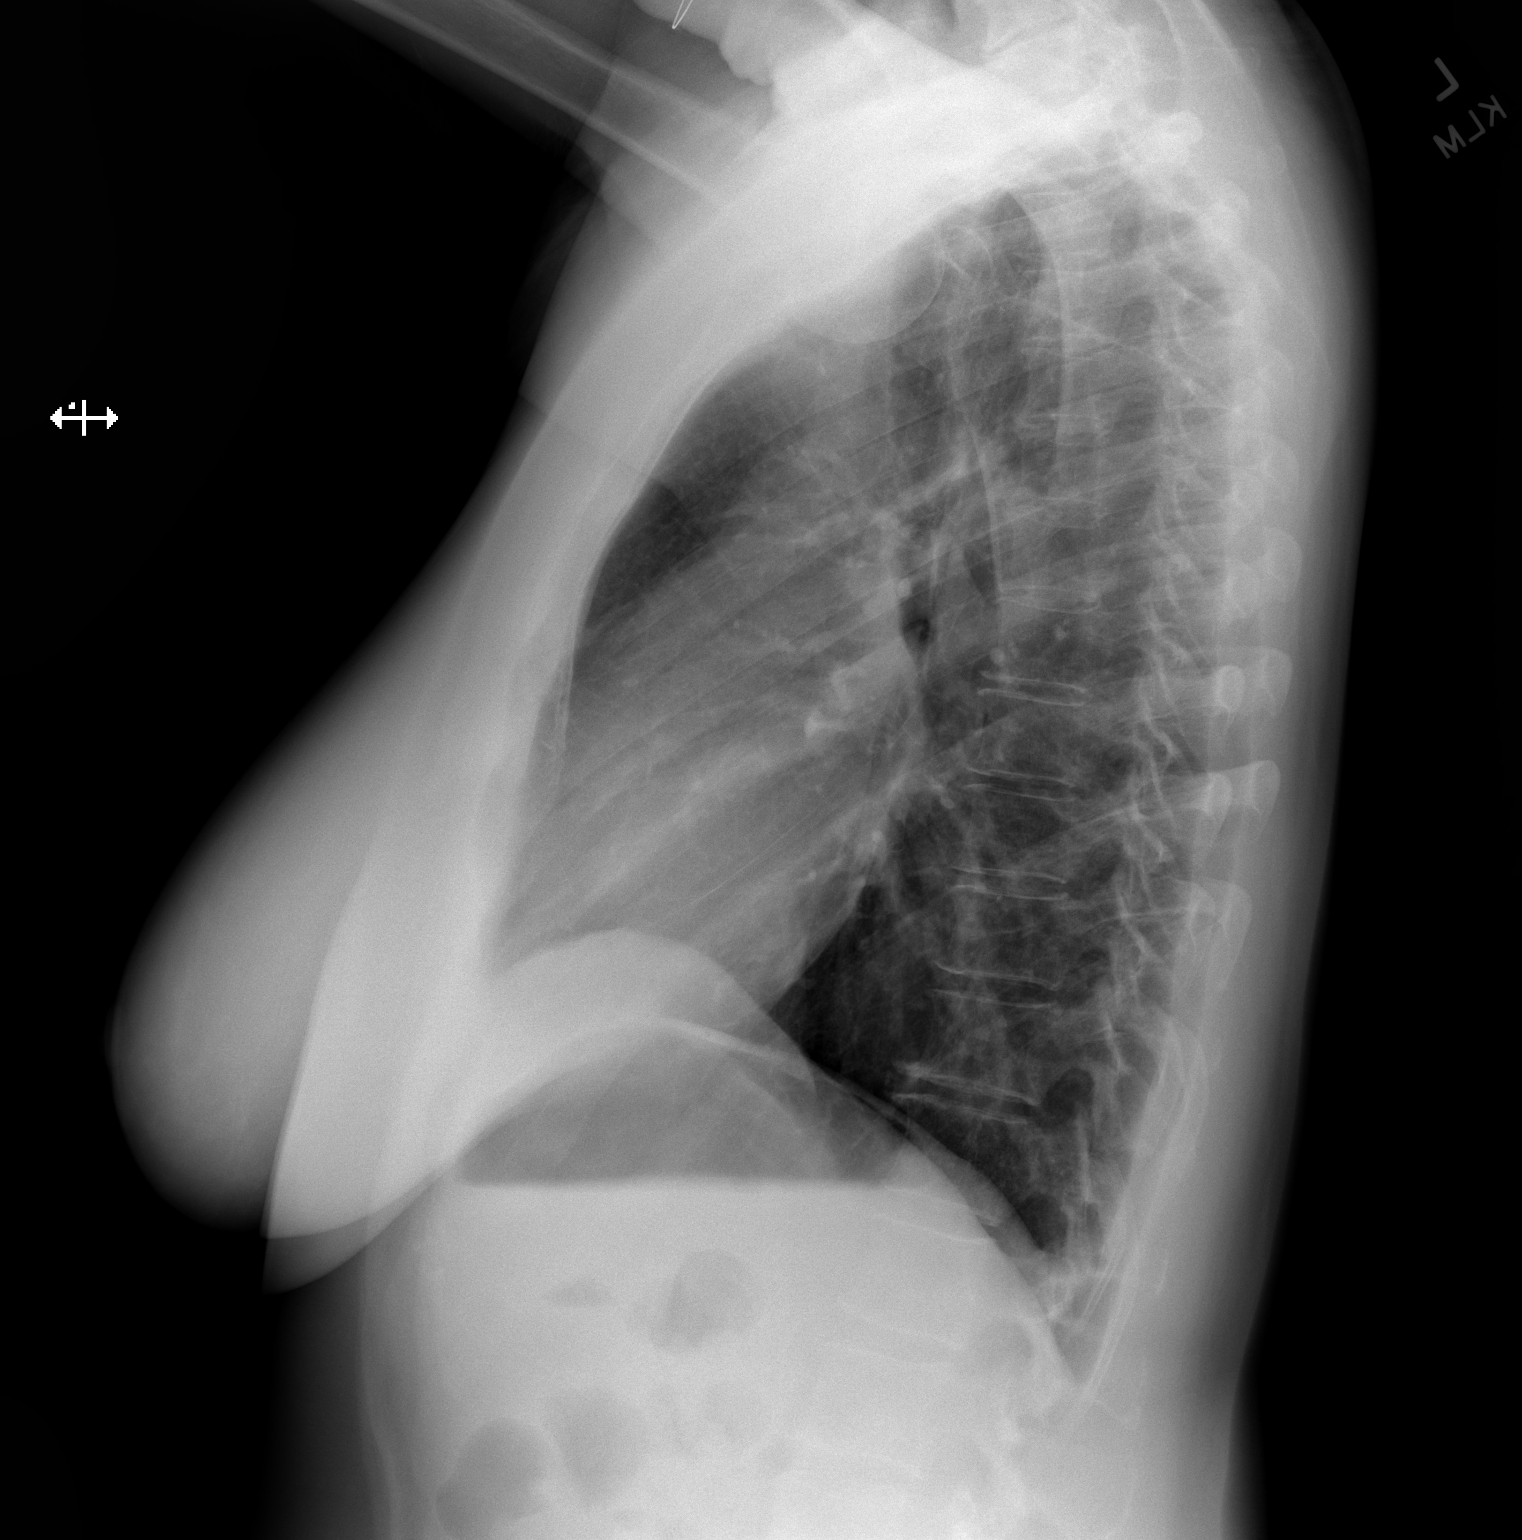

[2 of 2 positions shown; findings below may reference images not displayed]

FINDINGS: Largely resolved bilateral lower lung patchy peribronchial opacity.

However, there is a persistent spiculated 14 mm nodular density in
the periphery of the left upper lung, just above the level of the
hilum.

Lung markings elsewhere appears stable since 6553. Attenuated upper
lobe bronchovascular markings raises the possibility of emphysema.
Mediastinal contours remain normal. Visualized tracheal air column
is within normal limits. No acute osseous abnormality identified.
Negative visible bowel gas pattern.
IMPRESSION: 1. Unresolved 14 mm spiculated left upper lung nodule. While this
could be postinflammatory, if it persists on additional chest x-ray
follow-up in another 3-4 weeks then further characterization with
Chest CT would be recommended (noncontrast CT should suffice).
2. Bilateral lower lung bronchopneumonia appears resolved.

## 2022-08-16 ENCOUNTER — Ambulatory Visit (INDEPENDENT_AMBULATORY_CARE_PROVIDER_SITE_OTHER): Payer: BC Managed Care – PPO | Admitting: Physician Assistant

## 2022-08-16 ENCOUNTER — Encounter: Payer: Self-pay | Admitting: Physician Assistant

## 2022-08-16 VITALS — BP 120/80 | HR 70 | Temp 97.3°F | Ht 65.5 in | Wt 170.0 lb

## 2022-08-16 DIAGNOSIS — E559 Vitamin D deficiency, unspecified: Secondary | ICD-10-CM | POA: Diagnosis not present

## 2022-08-16 DIAGNOSIS — Z0000 Encounter for general adult medical examination without abnormal findings: Secondary | ICD-10-CM

## 2022-08-16 DIAGNOSIS — R0683 Snoring: Secondary | ICD-10-CM

## 2022-08-16 DIAGNOSIS — E663 Overweight: Secondary | ICD-10-CM | POA: Diagnosis not present

## 2022-08-16 DIAGNOSIS — E88819 Insulin resistance, unspecified: Secondary | ICD-10-CM

## 2022-08-16 NOTE — Patient Instructions (Addendum)
It was great to see you  Please message me with WHERE you would like to get your mammogram and I will place the order in Please message me if you want to pursue sleep study and/or ENT evaluation  Please make an appointment with the lab on your way out. I would like for you to return for lab work within 1-2 weeks. After midnight on the day of the lab draw, please do not eat anything. You may have water, black coffee, unsweetened tea.  Take care,  Aldona Bar

## 2022-08-16 NOTE — Progress Notes (Signed)
Subjective:    Emily Meyer is a 56 y.o. female and is here for a comprehensive physical exam.  HPI  Health Maintenance Due  Topic Date Due   MAMMOGRAM  03/19/2022    Acute Concerns: Suspected sleep apnea Patient reports that she snores loudly for hours when sleeping. She reports that her daughter tells her of her snoring at night. Her daughter also mentioned that the patient breaths in abruptly and audibly through her nose throughout the day in short intervals.  Chronic Issues: None  Health Maintenance: Immunizations -- She is uninterested in receiving an influenza vaccine this visit. Mammogram last completed on 03/19/2020. PAP last completed on 12/31/2019. Bone Density last completed on 03/10/2005. Diet -- Patient reports that she maintains a well balanced diet. Exercise -- She is participating in regular exercise.  Sleep habits -- Patient reports that she doesn't get enough sleep because of her line of work. Mood -- She reports that she is in a stable mood this visit.  UTD with dentist? - She is not UTD on dental care. UTD with eye doctor? - She is UTD on vision care.  Weight history: Wt Readings from Last 10 Encounters:  08/16/22 170 lb (77.1 kg)  01/26/21 172 lb (78 kg)  11/13/20 178 lb 3.2 oz (80.8 kg)  08/13/20 170 lb (77.1 kg)  08/10/20 169 lb 12.8 oz (77 kg)  07/30/20 167 lb (75.8 kg)  06/29/20 168 lb (76.2 kg)  06/29/20 165 lb (74.8 kg)  04/15/20 173 lb (78.5 kg)  12/31/19 169 lb 8 oz (76.9 kg)   Body mass index is 27.86 kg/m. No LMP recorded. (Menstrual status: Irregular Periods).  Alcohol use:  reports no history of alcohol use.  Tobacco use:  Tobacco Use: Low Risk  (08/16/2022)   Patient History    Smoking Tobacco Use: Never    Smokeless Tobacco Use: Never    Passive Exposure: Not on file       08/16/2022    1:08 PM  Depression screen PHQ 2/9  Decreased Interest 0  Down, Depressed, Hopeless 0  PHQ - 2 Score 0     Other  providers/specialists: Patient Care Team: Inda Coke, [place] as PCP - General (Physician Assistant) O'Neal, Cassie Freer, MD as PCP - Cardiology (Internal Medicine) Melissa Montane, MD as Consulting Physician (Otolaryngology) Lavonna Monarch, MD (Inactive) as Consulting Physician (Dermatology)    PMHx, SurgHx, SocialHx, Medications, and Allergies were reviewed in the Visit Navigator and updated as appropriate.   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   Right Malar Cheek   Right sided facial pain      Past Surgical History:  Procedure Laterality Date   JOINT REPLACEMENT  July 2021, May 2022   hips replaced   TOTAL HIP ARTHROPLASTY       Family History  Problem Relation Age of Onset   Healthy Mother    Hypertension Father    AAA (abdominal aortic aneurysm) Father    Early death Father    Heart disease Father    Diabetes Brother    Skin cancer Maternal Grandfather    Breast cancer Paternal Aunt    Colon cancer Neg Hx     Social History   Tobacco Use   Smoking status: Never   Smokeless tobacco: Never  Vaping Use   Vaping Use: Never used  Substance Use Topics  Alcohol use: No   Drug use: No    Review of Systems:   Review of Systems  Constitutional:  Negative for chills, fever, malaise/fatigue and weight loss.  HENT:  Negative for hearing loss, sinus pain and sore throat.   Respiratory:  Negative for cough and hemoptysis.   Cardiovascular:  Negative for chest pain, palpitations, leg swelling and PND.  Gastrointestinal:  Negative for abdominal pain, constipation, diarrhea, heartburn, nausea and vomiting.  Genitourinary:  Negative for dysuria, frequency and urgency.  Musculoskeletal:  Negative for back pain, myalgias and neck pain.  Skin:  Negative for itching and rash.  Endo/Heme/Allergies:  Negative for polydipsia.  Psychiatric/Behavioral:  Negative  for depression. The patient is not nervous/anxious.     Objective:   BP 120/80 (BP Location: Left Arm, Patient Position: Sitting, Cuff Size: Normal)   Pulse 70   Temp (!) 97.3 F (36.3 C) (Temporal)   Ht 5' 5.5" (1.664 m)   Wt 170 lb (77.1 kg)   SpO2 98%   BMI 27.86 kg/m  Body mass index is 27.86 kg/m.   General Appearance:    Alert, cooperative, no distress, appears stated age  Head:    Normocephalic, without obvious abnormality, atraumatic  Eyes:    PERRL, conjunctiva/corneas clear, EOM's intact, fundi    benign, both eyes  Ears:    Normal TM's and external ear canals, both ears  Nose:   Nares normal, septum midline, mucosa normal, no drainage    or sinus tenderness  Throat:   Lips, mucosa, and tongue normal; teeth and gums normal  Neck:   Supple, symmetrical, trachea midline, no adenopathy;    thyroid:  no enlargement/tenderness/nodules; no carotid   bruit or JVD  Back:     Symmetric, no curvature, ROM normal, no CVA tenderness  Lungs:     Clear to auscultation bilaterally, respirations unlabored  Chest Wall:    No tenderness or deformity   Heart:    Regular rate and rhythm, S1 and S2 normal, no murmur, rub or gallop  Breast Exam:    Deferred  Abdomen:     Soft, non-tender, bowel sounds active all four quadrants,    no masses, no organomegaly  Genitalia:    Deferred  Extremities:   Extremities normal, atraumatic, no cyanosis or edema  Pulses:   2+ and symmetric all extremities  Skin:   Skin color, texture, turgor normal, no rashes or lesions  Lymph nodes:   Cervical, supraclavicular, and axillary nodes normal  Neurologic:   CNII-XII intact, normal strength, sensation and reflexes    throughout    Assessment/Plan:   Routine physical examination Today patient counseled on age appropriate routine health concerns for screening and prevention, each reviewed and up to date or declined. Immunizations reviewed and up to date or declined. Labs ordered and reviewed. Risk  factors for depression reviewed and negative. Hearing function and visual acuity are intact. ADLs screened and addressed as needed. Functional ability and level of safety reviewed and appropriate. Education, counseling and referrals performed based on assessed risks today. Patient provided with a copy of personalized plan for preventive services.  Vitamin D deficiency Update Vit D and provide recommendations accordingly  Insulin resistance Update A1c and provide recommendations accordingly  Overweight Continue efforts at healthy lifestyle  Snoring She will let me know if she would like to pursue sleep study vs ENT referral  I,Verona Buck,acting as a scribe for Inda Coke, PA.,have documented all relevant documentation on the behalf of Inda Coke,  PA,as directed by  Inda Coke, PA while in the presence of [place], [place].  I, Inda Coke, [place], have reviewed all documentation for this visit. The documentation on 08/16/22 for the exam, diagnosis, procedures, and orders are all accurate and complete.   Inda Coke, PA-C [place]

## 2022-08-23 ENCOUNTER — Other Ambulatory Visit (INDEPENDENT_AMBULATORY_CARE_PROVIDER_SITE_OTHER): Payer: BC Managed Care – PPO

## 2022-08-23 DIAGNOSIS — E88819 Insulin resistance, unspecified: Secondary | ICD-10-CM | POA: Diagnosis not present

## 2022-08-23 DIAGNOSIS — E663 Overweight: Secondary | ICD-10-CM | POA: Diagnosis not present

## 2022-08-23 DIAGNOSIS — E559 Vitamin D deficiency, unspecified: Secondary | ICD-10-CM

## 2022-08-23 LAB — HEMOGLOBIN A1C: Hgb A1c MFr Bld: 6 % (ref 4.6–6.5)

## 2022-08-23 LAB — COMPREHENSIVE METABOLIC PANEL
ALT: 23 U/L (ref 0–35)
AST: 21 U/L (ref 0–37)
Albumin: 4.3 g/dL (ref 3.5–5.2)
Alkaline Phosphatase: 52 U/L (ref 39–117)
BUN: 18 mg/dL (ref 6–23)
CO2: 28 mEq/L (ref 19–32)
Calcium: 9.4 mg/dL (ref 8.4–10.5)
Chloride: 103 mEq/L (ref 96–112)
Creatinine, Ser: 0.84 mg/dL (ref 0.40–1.20)
GFR: 77.13 mL/min (ref >60.00)
Glucose, Bld: 89 mg/dL (ref 70–99)
Potassium: 4.3 mEq/L (ref 3.5–5.1)
Sodium: 139 mEq/L (ref 135–145)
Total Bilirubin: 0.9 mg/dL (ref 0.2–1.2)
Total Protein: 7 g/dL (ref 6.0–8.3)

## 2022-08-23 LAB — LIPID PANEL
Cholesterol: 226 mg/dL — ABNORMAL HIGH (ref 0–200)
HDL: 75.6 mg/dL (ref >39.00)
LDL Cholesterol: 133 mg/dL — ABNORMAL HIGH (ref 0–99)
NonHDL: 150.01
Total CHOL/HDL Ratio: 3
Triglycerides: 85 mg/dL (ref 0.0–149.0)
VLDL: 17 mg/dL (ref 0.0–40.0)

## 2022-08-23 LAB — CBC WITH DIFFERENTIAL/PLATELET
Basophils Absolute: 0 10*3/uL (ref 0.0–0.1)
Basophils Relative: 0.5 % (ref 0.0–3.0)
Eosinophils Absolute: 0 10*3/uL (ref 0.0–0.7)
Eosinophils Relative: 0.9 % (ref 0.0–5.0)
HCT: 39.9 % (ref 36.0–46.0)
Hemoglobin: 13.5 g/dL (ref 12.0–15.0)
Lymphocytes Relative: 30.6 % (ref 12.0–46.0)
Lymphs Abs: 1.8 10*3/uL (ref 0.7–4.0)
MCHC: 33.8 g/dL (ref 30.0–36.0)
MCV: 91.1 fl (ref 78.0–100.0)
Monocytes Absolute: 0.4 10*3/uL (ref 0.1–1.0)
Monocytes Relative: 7.1 % (ref 3.0–12.0)
Neutro Abs: 3.5 10*3/uL (ref 1.4–7.7)
Neutrophils Relative %: 60.9 % (ref 43.0–77.0)
Platelets: 217 10*3/uL (ref 150.0–400.0)
RBC: 4.38 Mil/uL (ref 3.87–5.11)
RDW: 13.9 % (ref 11.5–15.5)
WBC: 5.8 10*3/uL (ref 4.0–10.5)

## 2022-08-23 LAB — VITAMIN D 25 HYDROXY (VIT D DEFICIENCY, FRACTURES): VITD: 27.66 ng/mL — ABNORMAL LOW (ref 30.00–100.00)

## 2022-12-13 ENCOUNTER — Other Ambulatory Visit: Payer: Self-pay | Admitting: Physician Assistant

## 2022-12-13 ENCOUNTER — Telehealth: Payer: Self-pay | Admitting: Physician Assistant

## 2022-12-13 DIAGNOSIS — E785 Hyperlipidemia, unspecified: Secondary | ICD-10-CM

## 2022-12-13 NOTE — Telephone Encounter (Signed)
Pt states she was told at last visit that we would send a CT scan order because of calcium buildup. Please advise.

## 2022-12-13 NOTE — Telephone Encounter (Signed)
Please see message and advise 

## 2022-12-14 NOTE — Telephone Encounter (Signed)
Left message on voicemail to call office.  

## 2022-12-14 NOTE — Telephone Encounter (Signed)
Patient returned call. Requests to be called. 

## 2022-12-15 NOTE — Telephone Encounter (Signed)
Spoke to pt told her Aldona Bar ordered the Calcium Score test for her. Pt said yes, they contacted her yesterday and scheduled. Told her okay.

## 2022-12-29 ENCOUNTER — Ambulatory Visit (HOSPITAL_COMMUNITY)
Admission: RE | Admit: 2022-12-29 | Discharge: 2022-12-29 | Disposition: A | Discharge status: Home / Self Care | Payer: BC Managed Care – PPO | Source: Ambulatory Visit | Attending: Physician Assistant | Admitting: Physician Assistant

## 2022-12-29 DIAGNOSIS — E785 Hyperlipidemia, unspecified: Secondary | ICD-10-CM | POA: Insufficient documentation

## 2022-12-30 ENCOUNTER — Encounter: Payer: Self-pay | Admitting: Physician Assistant

## 2023-09-20 NOTE — Progress Notes (Signed)
Subjective:    Emily Meyer is a 57 y.o. female and is here for a comprehensive physical exam.  HPI  Health Maintenance Due  Topic Date Due   MAMMOGRAM  03/19/2022   Fecal DNA (Cologuard)  01/21/2023    Acute Concerns: Snoring; suspected obstructive sleep apnea She has had significant snoring -- her phone records this Does have some issues with coughing in the middle of the night per her phone Having some issues with daytime fatigue  Chronic Issues: None  Labs are pending. Takes magnesium, iron, iodine, zinc, calcium, collagen, Vitamin B, Vitamin C. Requesting STD panel- denies vaginal discharge. She sees a dermatologist annually- no new or concerning skin problems. Denies numbness/tingling/tremor, leg swelling, GI problems.  Health Maintenance: Immunizations -- Tetanus postponed at patient's request. Colonoscopy -- Due for first screening. Reminded. Does not want to do this. Mammogram -- Normal on 03/19/20. She reports that she will try to get this done. PAP -- Normal on 12/31/19. Bone Density -- N/A Diet -- Healthy Exercise -- Walk, weight training.  Sleep habits -- She snores. Believes she does not get enough sleep due to going to bed too late. Not aware of any gasping. Mood -- Stable  UTD with dentist? - No UTD with eye doctor? - No  Weight history: Wt Readings from Last 10 Encounters:  09/27/23 163 lb (73.9 kg)  08/16/22 170 lb (77.1 kg)  01/26/21 172 lb (78 kg)  11/13/20 178 lb 3.2 oz (80.8 kg)  08/13/20 170 lb (77.1 kg)  08/10/20 169 lb 12.8 oz (77 kg)  07/30/20 167 lb (75.8 kg)  06/29/20 168 lb (76.2 kg)  06/29/20 165 lb (74.8 kg)  04/15/20 173 lb (78.5 kg)   Body mass index is 27.55 kg/m. No LMP recorded. (Menstrual status: Irregular Periods).  Alcohol use:  reports no history of alcohol use.  Tobacco use:  Tobacco Use: Low Risk  (09/27/2023)   Patient History    Smoking Tobacco Use: Never    Smokeless Tobacco Use: Never    Passive  Exposure: Not on file   Eligible for lung cancer screening? No     09/27/2023    1:41 PM  Depression screen PHQ 2/9  Decreased Interest 0  Down, Depressed, Hopeless 0  PHQ - 2 Score 0     Other providers/specialists: Patient Care Team: Jarold Motto, [place] as PCP - General (Physician Assistant) O'Neal, Ronnald Ramp, MD as PCP - Cardiology (Internal Medicine) Suzanna Obey, MD as Consulting Physician (Otolaryngology) Janalyn Harder, MD (Inactive) as Consulting Physician (Dermatology)    PMHx, SurgHx, SocialHx, Medications, and Allergies were reviewed in the Visit Navigator and updated as appropriate.   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   Right Malar Cheek   Right sided facial pain      Past Surgical History:  Procedure Laterality Date   JOINT REPLACEMENT  July 2021, May 2022   hips replaced   TOTAL HIP ARTHROPLASTY       Family History  Problem Relation Age of Onset   Healthy Mother    Hypertension Father    AAA (abdominal aortic aneurysm) Father    Early death Father    Heart disease Father    Diabetes Brother    Skin cancer Maternal Grandfather    Breast cancer Paternal Aunt    Colon cancer Neg Hx  Social History   Tobacco Use   Smoking status: Never   Smokeless tobacco: Never  Vaping Use   Vaping status: Never Used  Substance Use Topics   Alcohol use: No   Drug use: No    Review of Systems:   Review of Systems  Constitutional:  Negative for chills, fever, malaise/fatigue and weight loss.  HENT:  Negative for hearing loss, sinus pain and sore throat.   Eyes:  Negative for blurred vision.  Respiratory:  Negative for cough, hemoptysis and shortness of breath.   Cardiovascular:  Negative for chest pain, palpitations, leg swelling and PND.  Gastrointestinal:  Positive for heartburn. Negative for abdominal pain, constipation,  diarrhea, nausea and vomiting.  Genitourinary:  Negative for dysuria, frequency and urgency.  Musculoskeletal:  Negative for back pain, myalgias and neck pain.  Skin:  Negative for itching and rash.  Neurological:  Negative for dizziness, tingling, tremors, seizures and headaches.  Endo/Heme/Allergies:  Negative for polydipsia.  Psychiatric/Behavioral:  Negative for depression. The patient is not nervous/anxious.     Objective:   BP 128/80 (BP Location: Left Arm, Patient Position: Sitting, Cuff Size: Normal)   Pulse 73   Temp 98 F (36.7 C) (Temporal)   Ht 5' 4.5" (1.638 m)   Wt 163 lb (73.9 kg)   SpO2 97%   BMI 27.55 kg/m  Body mass index is 27.55 kg/m.   General Appearance:    Alert, cooperative, no distress, appears stated age  Head:    Normocephalic, without obvious abnormality, atraumatic  Eyes:    PERRL, conjunctiva/corneas clear, EOM's intact, fundi    benign, both eyes  Ears:    Normal TM's and external ear canals, both ears  Nose:   Nares normal, septum midline, mucosa normal, no drainage    or sinus tenderness  Throat:   Lips, mucosa, and tongue normal; teeth and gums normal  Neck:   Supple, symmetrical, trachea midline, no adenopathy;    thyroid:  no enlargement/tenderness/nodules; no carotid   bruit or JVD  Back:     Symmetric, no curvature, ROM normal, no CVA tenderness  Lungs:     Clear to auscultation bilaterally, respirations unlabored  Chest Wall:    No tenderness or deformity   Heart:    Regular rate and rhythm, S1 and S2 normal, no murmur, rub or gallop  Breast Exam:    Deferred  Abdomen:     Soft, non-tender, bowel sounds active all four quadrants,    no masses, no organomegaly  Genitalia:    Normal female without lesion, discharge or tenderness  Extremities:   Extremities normal, atraumatic, no cyanosis or edema  Pulses:   2+ and symmetric all extremities  Skin:   Skin color, texture, turgor normal, no rashes or lesions  Lymph nodes:   Cervical,  supraclavicular, and axillary nodes normal  Neurologic:   CNII-XII intact, normal strength, sensation and reflexes    throughout    Assessment/Plan:   Routine physical examination Today patient counseled on age appropriate routine health concerns for screening and prevention, each reviewed and up to date or declined. Immunizations reviewed and up to date or declined. Labs ordered and reviewed. Risk factors for depression reviewed and negative. Hearing function and visual acuity are intact. ADLs screened and addressed as needed. Functional ability and level of safety reviewed and appropriate. Education, counseling and referrals performed based on assessed risks today. Patient provided with a copy of personalized plan for preventive services.  Snoring Refer to sleep studies  Insulin resistance Continue to monitor Update Hemoglobin A1c today  Hyperlipidemia, unspecified hyperlipidemia type Update lipid panel and provide recommendations  Screening examination for STD (sexually transmitted disease) Cervicovaginal swab and RPR/HIV testing completed today  I,Alexander Ruley,acting as a scribe for [employer], PA.,have documented all relevant documentation on the behalf of Jarold Motto, PA,as directed by  Jarold Motto, PA while in the presence of Jarold Motto, [place].  I, Jarold Motto, [place], have reviewed all documentation for this visit. The documentation on 09/27/23 for the exam, diagnosis, procedures, and orders are all accurate and complete.  Jarold Motto, PA-C [place]

## 2023-09-27 ENCOUNTER — Ambulatory Visit (INDEPENDENT_AMBULATORY_CARE_PROVIDER_SITE_OTHER): Payer: 59 | Admitting: Physician Assistant

## 2023-09-27 ENCOUNTER — Other Ambulatory Visit (HOSPITAL_COMMUNITY)
Admission: RE | Admit: 2023-09-27 | Discharge: 2023-09-27 | Disposition: A | Discharge status: Home / Self Care | Payer: 59 | Source: Ambulatory Visit | Attending: Physician Assistant | Admitting: Physician Assistant

## 2023-09-27 ENCOUNTER — Encounter: Payer: Self-pay | Admitting: Physician Assistant

## 2023-09-27 VITALS — BP 128/80 | HR 73 | Temp 98.0°F | Ht 64.5 in | Wt 163.0 lb

## 2023-09-27 DIAGNOSIS — Z113 Encounter for screening for infections with a predominantly sexual mode of transmission: Secondary | ICD-10-CM | POA: Insufficient documentation

## 2023-09-27 DIAGNOSIS — Z0000 Encounter for general adult medical examination without abnormal findings: Secondary | ICD-10-CM | POA: Diagnosis present

## 2023-09-27 DIAGNOSIS — E88819 Insulin resistance, unspecified: Secondary | ICD-10-CM

## 2023-09-27 DIAGNOSIS — E785 Hyperlipidemia, unspecified: Secondary | ICD-10-CM

## 2023-09-27 DIAGNOSIS — R0683 Snoring: Secondary | ICD-10-CM

## 2023-09-27 NOTE — Patient Instructions (Signed)
It was great to see you!  I will be in touch with all results  I will place referral for sleep evaluation  Please go to the lab for blood work.   Our office will call you with your results unless you have chosen to receive results via MyChart.  If your blood work is normal we will follow-up each year for physicals and as scheduled for chronic medical problems.  If anything is abnormal we will treat accordingly and get you in for a follow-up.  Take care,  Lelon Mast

## 2023-09-28 LAB — CBC WITH DIFFERENTIAL/PLATELET
Basophils Absolute: 0 10*3/uL (ref 0.0–0.1)
Basophils Relative: 0.7 % (ref 0.0–3.0)
Eosinophils Absolute: 0.1 10*3/uL (ref 0.0–0.7)
Eosinophils Relative: 1.1 % (ref 0.0–5.0)
HCT: 38.9 % (ref 36.0–46.0)
Hemoglobin: 13 g/dL (ref 12.0–15.0)
Lymphocytes Relative: 30.6 % (ref 12.0–46.0)
Lymphs Abs: 1.8 10*3/uL (ref 0.7–4.0)
MCHC: 33.3 g/dL (ref 30.0–36.0)
MCV: 91.9 fL (ref 78.0–100.0)
Monocytes Absolute: 0.4 10*3/uL (ref 0.1–1.0)
Monocytes Relative: 7.2 % (ref 3.0–12.0)
Neutro Abs: 3.5 10*3/uL (ref 1.4–7.7)
Neutrophils Relative %: 60.4 % (ref 43.0–77.0)
Platelets: 213 10*3/uL (ref 150.0–400.0)
RBC: 4.23 Mil/uL (ref 3.87–5.11)
RDW: 13.6 % (ref 11.5–15.5)
WBC: 5.7 10*3/uL (ref 4.0–10.5)

## 2023-09-28 LAB — COMPREHENSIVE METABOLIC PANEL
ALT: 21 U/L (ref 0–35)
AST: 21 U/L (ref 0–37)
Albumin: 4.3 g/dL (ref 3.5–5.2)
Alkaline Phosphatase: 51 U/L (ref 39–117)
BUN: 15 mg/dL (ref 6–23)
CO2: 27 meq/L (ref 19–32)
Calcium: 9.3 mg/dL (ref 8.4–10.5)
Chloride: 103 meq/L (ref 96–112)
Creatinine, Ser: 0.74 mg/dL (ref 0.40–1.20)
GFR: 89.12 mL/min (ref >60.00)
Glucose, Bld: 81 mg/dL (ref 70–99)
Potassium: 4 meq/L (ref 3.5–5.1)
Sodium: 139 meq/L (ref 135–145)
Total Bilirubin: 1.2 mg/dL (ref 0.2–1.2)
Total Protein: 6.9 g/dL (ref 6.0–8.3)

## 2023-09-28 LAB — RPR: RPR Ser Ql: NONREACTIVE

## 2023-09-28 LAB — HIV ANTIBODY (ROUTINE TESTING W REFLEX): HIV 1&2 Ab, 4th Generation: NONREACTIVE

## 2023-09-28 LAB — LIPID PANEL
Cholesterol: 203 mg/dL — ABNORMAL HIGH (ref 0–200)
HDL: 65.2 mg/dL (ref >39.00)
LDL Cholesterol: 125 mg/dL — ABNORMAL HIGH (ref 0–99)
NonHDL: 138.05
Total CHOL/HDL Ratio: 3
Triglycerides: 63 mg/dL (ref 0.0–149.0)
VLDL: 12.6 mg/dL (ref 0.0–40.0)

## 2023-09-28 LAB — CERVICOVAGINAL ANCILLARY ONLY
Chlamydia: NEGATIVE
Comment: NEGATIVE
Comment: NEGATIVE
Comment: NORMAL
Neisseria Gonorrhea: NEGATIVE
Trichomonas: NEGATIVE

## 2023-09-28 LAB — HEMOGLOBIN A1C: Hgb A1c MFr Bld: 6 % (ref 4.6–6.5)

## 2023-11-07 ENCOUNTER — Ambulatory Visit (INDEPENDENT_AMBULATORY_CARE_PROVIDER_SITE_OTHER): Payer: 59 | Admitting: Neurology

## 2023-11-07 ENCOUNTER — Encounter: Payer: Self-pay | Admitting: Neurology

## 2023-11-07 VITALS — BP 114/70 | HR 76 | Ht 66.0 in | Wt 166.0 lb

## 2023-11-07 DIAGNOSIS — E663 Overweight: Secondary | ICD-10-CM

## 2023-11-07 DIAGNOSIS — Z9189 Other specified personal risk factors, not elsewhere classified: Secondary | ICD-10-CM

## 2023-11-07 DIAGNOSIS — G4719 Other hypersomnia: Secondary | ICD-10-CM

## 2023-11-07 DIAGNOSIS — R0683 Snoring: Secondary | ICD-10-CM

## 2023-11-07 DIAGNOSIS — R351 Nocturia: Secondary | ICD-10-CM

## 2023-11-07 NOTE — Patient Instructions (Signed)

## 2023-11-07 NOTE — Progress Notes (Signed)
Subjective:    Patient ID: Emily Meyer is a 58 y.o. female.  HPI    Huston Foley, MD, PhD [facility] [address] [address] [place], [place] [address]  Dear Emily Meyer,  I saw your patient, Emily Meyer, upon your kind request in my sleep clinic today for initial consultation of her sleep disorder, in particular, concern for underlying obstructive sleep apnea.  The patient is unaccompanied today.  As you know, Ms. Nasuti is a 58 year old female with an underlying medical history of hyperlipidemia, insulin resistance, irritable bowel syndrome, basal cell cancer of the right face, allergies, and mildly overweight state, who reports snoring and excessive daytime somnolence.  Her Epworth sleepiness score is 13 out of 24, fatigue severity score is 36 out of 63.  I reviewed your office note from 09/27/2023. She has seen Dr. Terrace Arabia in this office for facial pain in 2018. Snoring has become worse over time.  Her tiredness has become worse over time as well.  She does not really have any trouble falling asleep or staying asleep.  She has nocturia about once or twice per night but goes back to sleep fairly well.  She is divorced and lives with one of her daughters, she has 2 grown daughters altogether.  Bedtime is between 10 and 11 and rise time around 6.  She has no known family history of sleep apnea.  She is a non-smoker and does not utilize alcohol or caffeine.  She does not have a TV in her bedroom.  She has no recurrent nocturnal morning headaches.  They have no pets in the household.  She works full-time as a Administrator.  She is working on weight loss.  Her Past Medical History Is Significant For: 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   Right Malar Cheek   Right sided facial pain     Her Past Surgical History Is Significant For: Past  Surgical History:  Procedure Laterality Date   JOINT REPLACEMENT  July 2021, May 2022   hips replaced   MOHS SURGERY  2021   TOTAL HIP ARTHROPLASTY      Her Family History Is Significant For: Family History  Problem Relation Age of Onset   Healthy Mother    Hypertension Father    AAA (abdominal aortic aneurysm) Father    Early death Father    Heart disease Father    Diabetes Brother    Skin cancer Maternal Grandfather    Breast cancer Paternal Aunt    Colon cancer Neg Hx     Her Social History Is Significant For: Social History   Socioeconomic History   Marital status: Divorced    Spouse name: Not on file   Number of children: 2   Years of education: 14   Highest education level: Tax adviser degree: occupational, Scientist, product/process development, or vocational program  Occupational History   Occupation: Clinical biochemist Rep    Employer: [employer]  Tobacco Use   Smoking status: Never   Smokeless tobacco: Never  Vaping Use   Vaping status: Never Used  Substance and Sexual Activity   Alcohol use: Never   Drug use: Never   Sexual activity: Not Currently    Birth control/protection: Post-menopausal  Other Topics Concern   Not on file  Social History Narrative   Divorced in 2018   Older daughter  lives with her, currently unemployed   Lives with younger daughter   Administrator   Caffeine: never   Social Drivers of Corporate investment banker Strain: Not on [employer] Insecurity: Not on file  Transportation Needs: Not on file  Physical Activity: Not on file  Stress: Not on file  Social Connections: Not on file    Her Allergies Are:  Allergies  Allergen Reactions   Adhesive [Tape]     rash   Fish Allergy Swelling    Lip swelling   Other     PEANUTS - lip swelling  :   Her Current Medications Are:  Outpatient Encounter Medications as of 11/07/2023  Medication Sig   Ascorbic Acid (VITAMIN C PO) Take 1,000 mg by mouth.   ASTAXANTHIN PO Take 2 mg  by mouth.   b complex vitamins tablet Take 1 tablet by mouth daily. Contains Thiamine 100 mg, Riboflavin 25 mg, Folate 680 mcg, B12 500 mcg, Biotin 400 mcg, Pantothenic Acid 200 mg   BETAINE PO Take 500 mg by mouth.   BIOTIN PO Take 10 mg by mouth.   CALCIUM PO Take 500 mg by mouth.   Cholecalciferol (VITAMIN D3 PO) Take 5,000 Int'l Units by mouth.   Coenzyme Q10 (COQ-10 PO) Take 100 mg by mouth.   COLLAGEN PO Take by mouth. In a protein drink   Flaxseed, Linseed, (EQL FLAX SEED OIL) 1000 MG CAPS Take 1,000 mg by mouth 2 (two) times daily.   Glutathione 500 MG CAPS Take 500 mg by mouth daily.   IRON PO Take 29 mg by mouth as needed.   Magnesium Oxide (MAG-CAPS PO) Take 200 mg by mouth daily.   Menaquinone-7 (VITAMIN K2 PO) Take 500 mcg by mouth.   VITAMIN A PO Take 1,500 mcg by mouth.   VITAMIN E PO Take 400 mg by mouth.   Zinc Sulfate (ZINC 15 PO) Take 30 mg by mouth daily.   [DISCONTINUED] Iodine, Kelp, (KELP PO) Take by mouth daily. (Patient not taking: Reported on 11/07/2023)   No facility-administered encounter medications on file as of 11/07/2023.  :   Review of Systems:  Out of a complete 14 point review of systems, all are reviewed and negative with the exception of these symptoms as listed below:     Review of Systems  Neurological:        Patient is here alone for sleep consult. She endorses snoring and waking up exhausted. She will wake up to use the bathroom overnight on average of 2 times per night. She has never had a sleep study before. She has no known family history of sleep apnea. ESS 15 FSS 36    Objective:  Neurological Exam  Physical Exam Physical Examination:   Vitals:   11/07/23 1101  BP: 114/70  Pulse: 76    General Examination: The patient is a very pleasant 58 y.o. female in no acute distress. She appears well-developed and well-nourished and well groomed.   HEENT: Normocephalic, atraumatic, pupils are equal, round and reactive to light,  extraocular tracking is good without limitation to gaze excursion or nystagmus noted. Hearing is grossly intact. Face is symmetric with normal facial animation and very slight intermittent right lower facial twitching noted, patient is not aware of this upon further asking. Speech is clear with no dysarthria noted. There is no hypophonia. There is no lip, neck/head, jaw or voice tremor. Neck is supple with full range of passive and active motion. There  are no carotid bruits on auscultation. Oropharynx exam reveals: Significant mouth dryness, good dental hygiene, mild airway crowding with small airway entry noted, Mallampati class II, tonsils on the smaller side.  Tongue protrudes slightly deviating to the right, this is not a new finding per patient recollection.  Palate elevates symmetrically.  Uvula central.  Neck circumference 14-7/8 inches, mild to moderate overbite noted.    Chest: Clear to auscultation without wheezing, rhonchi or crackles noted.  Heart: S1+S2+0, regular and normal without murmurs, rubs or gallops noted.   Abdomen: Soft, non-tender and non-distended.  Extremities: There is no pitting edema in the distal lower extremities bilaterally.   Skin: Warm and dry without trophic changes noted.   Musculoskeletal: exam reveals no obvious joint deformities.   Neurologically:  Mental status: The patient is awake, alert and oriented in all 4 spheres. Her immediate and remote memory, attention, language skills and fund of knowledge are appropriate. There is no evidence of aphasia, agnosia, apraxia or anomia. Speech is clear with normal prosody and enunciation. Thought process is linear. Mood is normal and affect is normal.  Cranial nerves II - XII are as described above under HEENT exam.  Motor exam: Normal bulk, strength and tone is noted. There is no obvious action or resting tremor.  Fine motor skills and coordination: grossly intact.  Cerebellar testing: No dysmetria or intention  tremor. There is no truncal or gait ataxia.  Sensory exam: intact to light touch in the upper and lower extremities.  Gait, station and balance: She stands easily. No veering to one side is noted. No leaning to one side is noted. Posture is age-appropriate and stance is narrow based. Gait shows normal stride length and normal pace. No problems turning are noted.   Assessment and Plan:  In summary, Sharley Bohner is a very pleasant 58 y.o.-year old female with an underlying medical history of hyperlipidemia, insulin resistance, irritable bowel syndrome, basal cell cancer of the right face, allergies, and mildly overweight state, whose history and physical exam are concerning for sleep disordered breathing, particularly obstructive sleep apnea (OSA).  A laboratory attended sleep study is typically considered "gold standard" for evaluation of sleep disordered breathing.   I had a long chat with the patient about my findings and the diagnosis of sleep apnea, particularly OSA, its prognosis and treatment options. We talked about medical/conservative treatments, surgical interventions and non-pharmacological approaches for symptom control. I explained, in particular, the risks and ramifications of untreated moderate to severe OSA, especially with respect to developing cardiovascular disease down the road, including congestive heart failure (CHF), difficult to treat hypertension, cardiac arrhythmias (particularly A-fib), neurovascular complications including TIA, stroke and dementia. Even type 2 diabetes has, in part, been linked to untreated OSA. Symptoms of untreated OSA may include (but may not be limited to) daytime sleepiness, nocturia (i.e. frequent nighttime urination), memory problems, mood irritability and suboptimally controlled or worsening mood disorder such as depression and/or anxiety, lack of energy, lack of motivation, physical discomfort, as well as recurrent headaches, especially morning or nocturnal  headaches. We talked about the importance of maintaining a healthy lifestyle and striving for healthy weight. I recommended a sleep study at this time. I outlined the differences between a laboratory attended sleep study which is considered more comprehensive and accurate over the option of a home sleep test (HST); the latter may lead to underestimation of sleep disordered breathing in some instances and does not help with diagnosing upper airway resistance syndrome and is not accurate  enough to diagnose primary central sleep apnea typically. I outlined possible surgical and non-surgical treatment options of OSA, including the use of a positive airway pressure (PAP) device (i.e. CPAP, AutoPAP/APAP or BiPAP in certain circumstances), a custom-made dental device (aka oral appliance, which would require a referral to a specialist dentist or orthodontist typically, and is generally speaking not considered for patients with full dentures or edentulous state), upper airway surgical options, such as traditional UPPP (which is not considered a first-line treatment) or the Inspire device (hypoglossal nerve stimulator, which would involve a referral for consultation with an ENT surgeon, after careful selection, following inclusion criteria - also not first-line treatment). I explained the PAP treatment option to the patient in detail, as this is generally considered first-line treatment.  The patient indicated that she would be willing to try PAP therapy, if the need arises. I explained the importance of being compliant with PAP treatment, not only for insurance purposes but primarily to improve patient's symptoms symptoms, and for the patient's long term health benefit, including to reduce Her cardiovascular risks longer-term.    We will pick up our discussion about the next steps and treatment options after testing.  We will keep her posted as to the test results by phone call and/or MyChart messaging where possible.   We will plan to follow-up in sleep clinic accordingly as well.  I answered all her questions today and the patient was in agreement.   I encouraged her to call with any interim questions, concerns, problems or updates or email [place] through MyChart.  Generally speaking, sleep test authorizations may take up to 2 weeks, sometimes less, sometimes longer, the patient is encouraged to get in touch with [place] if they do not hear back from the sleep lab staff directly within the next 2 weeks.  Thank you very much for allowing me to participate in the care of this nice patient. If I can be of any further assistance to you please do not hesitate to call me at [phone].  Sincerely,   Huston Foley, MD, PhD

## 2023-11-15 ENCOUNTER — Telehealth: Payer: Self-pay | Admitting: Neurology

## 2023-11-15 NOTE — Telephone Encounter (Signed)
NPSG- [employer] pending faxed notes.

## 2023-11-20 NOTE — Telephone Encounter (Signed)
[employer] denied the NPSG  HST [employer] no auth req spoke to [employer] ref # [id number]

## 2023-11-23 ENCOUNTER — Ambulatory Visit: Payer: 59 | Admitting: Neurology

## 2023-11-23 DIAGNOSIS — Z9189 Other specified personal risk factors, not elsewhere classified: Secondary | ICD-10-CM

## 2023-11-23 DIAGNOSIS — R351 Nocturia: Secondary | ICD-10-CM

## 2023-11-23 DIAGNOSIS — R0683 Snoring: Secondary | ICD-10-CM

## 2023-11-23 DIAGNOSIS — G4733 Obstructive sleep apnea (adult) (pediatric): Secondary | ICD-10-CM

## 2023-11-23 DIAGNOSIS — G4719 Other hypersomnia: Secondary | ICD-10-CM

## 2023-11-23 DIAGNOSIS — G4734 Idiopathic sleep related nonobstructive alveolar hypoventilation: Secondary | ICD-10-CM

## 2023-11-23 DIAGNOSIS — E663 Overweight: Secondary | ICD-10-CM

## 2023-12-13 ENCOUNTER — Telehealth: Payer: Self-pay | Admitting: Neurology

## 2023-12-13 DIAGNOSIS — G4733 Obstructive sleep apnea (adult) (pediatric): Secondary | ICD-10-CM

## 2023-12-13 NOTE — Procedures (Signed)
 [facility]  HOME SLEEP TEST (SANSA) REPORT (Mail-Out Device):   STUDY DATE: 11/25/2023  DOB: October 09, 1966  MRN: [id number]  ORDERING CLINICIAN: True Mar, MD, PhD   REFERRING CLINICIAN: Job Lukes, [place]   CLINICAL INFORMATION/HISTORY: 58 year old female with an underlying medical history of hyperlipidemia, insulin resistance, irritable bowel syndrome, basal cell cancer of the right face, allergies, and mildly overweight state, who reports snoring and excessive daytime somnolence.   PATIENT'S LAST REPORTED EPWORTH SLEEPINESS SCORE (ESS): 13/24.  BMI (at the time of sleep clinic visit and/or test date): 26.8 kg/m  FINDINGS:   Study Protocol:    The SANSA single-point-of-skin-contact chest-worn sensor - an FDA cleared and DOT approved type 4 home sleep test device - measures eight physiological channels,  including blood oxygen saturation (measured via PPG [photoplethysmography]), EKG-derived heart rate, respiratory effort, chest movement (measured via accelerometer), snoring, body position, and actigraphy. The device is designed to be worn for up to 10 hours per study.   Sleep Summary:   Total Recording Time (hours, min): 9 hours, 20 min  Total Effective Sleep Time (hours, min):  7 hours, 22 min  Sleep Efficiency (%):    79%   Respiratory Indices:   Calculated sAHI (per hour):  23.7/hour         Oxygen Saturation Statistics:    Oxygen Saturation (%) Mean: 93%   Minimum oxygen saturation (%):                 68.2%   O2 Saturation Range (%): 68.2- 100%   Time below or at 88% saturation: 32 min   Pulse Rate Statistics:   Pulse Mean (bpm):    64/min    Pulse Range (52 - 99/min)   Snoring: Mild to loud  IMPRESSION/DIAGNOSES:   OSA (obstructive sleep apnea), moderate Nocturnal Hypoxemia  RECOMMENDATIONS:   This home sleep test demonstrates moderate obstructive sleep apnea with a total AHI of 23.7/hour and O2 nadir of 68.2% and  significant time below or at 88% saturation of over 30 minutes, indicating nocturnal hypoxemia.  Snoring was variable, ranging from mild to louder. Treatment with a positive airway pressure (PAP) device is recommended. The patient will be advised to proceed with an autoPAP titration/trial at home for now. A full night titration study may be considered to optimize treatment settings, monitor proper oxygen saturations and aid with improvement of tolerance and adherence, if needed down the road. Alternative treatment options may include a dental device through dentistry or orthodontics in selected patients or Inspire (hypoglossal nerve stimulator) in carefully selected patients (meeting inclusion criteria).  Concomitant weight loss is recommended (where clinically appropriate). Please note that untreated obstructive sleep apnea may carry additional perioperative morbidity. Patients with significant obstructive sleep apnea should receive perioperative PAP therapy and the surgeons and particularly the anesthesiologist should be informed of the diagnosis and the severity of the sleep disordered breathing. The patient should be cautioned not to drive, work at heights, or operate dangerous or heavy equipment when tired or sleepy. Review and reiteration of good sleep hygiene measures should be pursued with any patient. Other causes of the patient's symptoms, including circadian rhythm disturbances, an underlying mood disorder, medication effect and/or an underlying medical problem cannot be ruled out based on this test. Clinical correlation is recommended.  The patient and her referring provider will be notified of the test results. The patient will be seen in follow up in sleep clinic at [facility].  I certify that I have reviewed the  raw data recording prior to the issuance of this report in accordance with the standards of the American Academy of Sleep Medicine (AASM).  INTERPRETING PHYSICIAN:   True Mar, MD,  PhD Medical Director, [facility]) Diplomat, ABPN (Neurology and Sleep)   [facility] [address] [place], [place] [address] [phone]

## 2023-12-13 NOTE — Telephone Encounter (Signed)
 Yes, will be read soon.

## 2023-12-13 NOTE — Progress Notes (Signed)
 See procedure note.

## 2023-12-13 NOTE — Telephone Encounter (Signed)
 Patient checking on home sleep study results. Have not heard from anyone. Would like a call back.

## 2023-12-13 NOTE — Addendum Note (Signed)
 Addended by: Huston Foley on: 12/13/2023 05:57 PM   Modules accepted: Orders

## 2023-12-18 ENCOUNTER — Telehealth: Payer: Self-pay

## 2023-12-18 NOTE — Telephone Encounter (Signed)
-----   Message from Huston Foley sent at 12/13/2023  5:57 PM EST ----- Patient referred by PCP, seen by me on 11/07/2023, patient had a HST on 11/25/2023.    Please call and notify the patient that the recent home sleep test showed obstructive sleep apnea in the moderate range. I recommend treatment in the form of autoPAP, which means, that we don't have to bring her in for a sleep study with CPAP, but will let her start using a so called autoPAP machine at home, which is a CPAP-like machine with self-adjusting pressures. We will send the order to a local DME company (of her choice, or as per insurance requirement). The DME representative will fit her with a mask, educate her on how to use the machine, how to put the mask on, etc. I have placed an order in the chart. Please send the order, talk to patient, send report to referring MD. We will need a FU in sleep clinic for 10 weeks post-PAP set up, please arrange that with me or one of our NPs. Also reinforce the need for compliance with treatment. Thanks,   Huston Foley, MD, PhD [facility])

## 2023-12-18 NOTE — Telephone Encounter (Signed)
 I called Emily Meyer. I advised Emily Meyer that Dr. Frances Furbish reviewed their sleep study results and found that Emily Meyer Moderate sleep Apnea. Dr. Frances Furbish recommends that Emily Meyer be treated with a CPAP. I reviewed PAP compliance expectations with the Emily Meyer. Emily Meyer is agreeable to starting a CPAP. I advised Emily Meyer that an order will be sent to a DME, [facility], and they will call the Emily Meyer within about one week after they file with the Emily Meyer's insurance. [facility] will show the Emily Meyer how to use the machine, fit for masks, and troubleshoot the CPAP if needed. A follow up appt is needed for insurance purposes with Dr. Frances Furbish on CPAP. Emily Meyer verbalized understanding to arrive 15 minutes early and bring their CPAP. Emily Meyer verbalized understanding of results. Emily Meyer had no questions at this time but was encouraged to call back if questions arise. I have sent the order to [facility] and have received confirmation that they have received the order.

## 2024-01-01 NOTE — Telephone Encounter (Signed)
 Zott, Linnell Fulling, [place] L, RN We only had the backup number that is the one we have3 been calling,  I have updated her account and emailed the local office to give her a call. Thank You

## 2024-01-01 NOTE — Telephone Encounter (Signed)
 Message sent to [facility] to check on this order.

## 2024-01-01 NOTE — Telephone Encounter (Signed)
 Zott, Linnell Fulling, [place], RN; [place], [place] HI Erich Kochan, we have called her a couple times to schedule a setup appt and she had not answered and her voice mail is full so we are unable to leave a message.  If we do not hear back from her by next call we will send her a letter to please call us.  If you have contact with her you can let her know to call us at [phone] to schedule her setup. Thank you

## 2024-01-18 NOTE — Telephone Encounter (Signed)
 Placed a new order for AutoPap pressure of 7 to 13 cm from currently 6 to 12 cm.  Please send DME company.

## 2024-01-18 NOTE — Addendum Note (Signed)
 Addended by: Huston Foley on: 01/18/2024 05:14 PM   Modules accepted: Orders

## 2024-01-18 NOTE — Telephone Encounter (Signed)
 Order sent to [facility]. Changes also made in airview.

## 2024-01-22 NOTE — Telephone Encounter (Signed)
 Zott, Linnell Fulling, Otilio Jefferson, RN; [place], [place] Got it Thank  You

## 2024-01-22 NOTE — Telephone Encounter (Signed)
 RE: change autopap settings,  done in [place] Received: Today Zott, Hennie Duos, RN; [place] Got it Thank You     Previous Messages    ----- Message ----- From: Guy Begin, RN Sent: 01/22/2024  10:36 AM EDT To: Marlou Porch Zott Subject: change autopap settings,  done in [place]      Order in epic,  done already in [name] Female, 57 y.o., 11/07/1965 MRN: [id number]   Thanks  Sa

## 2024-01-24 NOTE — Telephone Encounter (Signed)
 Setup  on 01/05/24.

## 2024-02-05 NOTE — Progress Notes (Signed)
 [facility] [address] [place]. [place] [address] [phone]       OFFICE FOLLOW UP NOTE  Ms. Bretta Fees Date of Birth:  05/03/1966 Medical Record Number:  [id number]    Primary neurologist: Dr. Omar Bibber Reason for visit: Initial CPAP follow-up    SUBJECTIVE:   CHIEF COMPLAINT:  No chief complaint on file.   Follow-up visit:   Brief HPI:   Emily Meyer is a 58 y.o. female who was seen by Dr. Omar Bibber in 10/2023 for concern of underlying sleep apnea with complaints of snoring and excessive daytime somnolence. ESS 13/24. FSS 36/63. HST 11/2023 showed moderate sleep apnea with total AHI of 23.7/hr and O2 nadir of 68.2% with significant time below or at 88% saturation of over 30 minutes, indicated nocturnal hypoxemia. AutoPAP set up 01/05/2024 with settings at 6-12.      Interval history:        ROS:   14 system review of systems performed and negative with exception of those listed in HPI  PMH:  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   Right Malar Cheek   Right sided facial pain     PSH:  Past Surgical History:  Procedure Laterality Date   JOINT REPLACEMENT  July 2021, May 2022   hips replaced   MOHS SURGERY  2021   TOTAL HIP ARTHROPLASTY      Social History:  Social History   Socioeconomic History   Marital status: Divorced    Spouse name: Not on file   Number of children: 2   Years of education: 14   Highest education level: Tax adviser degree: occupational, Scientist, product/process development, or vocational program  Occupational History   Occupation: Clinical biochemist Rep    Employer: [employer]  Tobacco Use   Smoking status: Never   Smokeless tobacco: Never  Vaping Use   Vaping status: Never Used  Substance and Sexual Activity   Alcohol use: Never   Drug use: Never   Sexual activity: Not Currently    Birth  control/protection: Post-menopausal  Other Topics Concern   Not on file  Social History Narrative   Divorced in 2018   Older daughter lives with her, currently unemployed   Lives with younger daughter   Administrator   Caffeine: never   Social Drivers of Corporate investment banker Strain: Not on file  Food Insecurity: Not on file  Transportation Needs: Not on file  Physical Activity: Not on file  Stress: Not on file  Social Connections: Not on file  Intimate Partner Violence: Not on file    Family History:  Family History  Problem Relation Age of Onset   Healthy Mother    Hypertension Father    AAA (abdominal aortic aneurysm) Father    Early death Father    Heart disease Father    Diabetes Brother    Skin cancer Maternal Grandfather    Breast cancer Paternal Aunt    Colon cancer Neg Hx     Medications:   Current Outpatient Medications on File Prior to Visit  Medication Sig Dispense Refill   Ascorbic Acid (VITAMIN C PO) Take 1,000 mg by mouth.     ASTAXANTHIN PO Take 2 mg by mouth.     b complex vitamins tablet Take 1 tablet by mouth daily. Contains Thiamine 100 mg, Riboflavin 25 mg, Folate 680  mcg, B12 500 mcg, Biotin 400 mcg, Pantothenic Acid 200 mg     BETAINE PO Take 500 mg by mouth.     BIOTIN PO Take 10 mg by mouth.     CALCIUM PO Take 500 mg by mouth.     Cholecalciferol  (VITAMIN D3 PO) Take 5,000 Int'l Units by mouth.     Coenzyme Q10 (COQ-10 PO) Take 100 mg by mouth.     COLLAGEN PO Take by mouth. In a protein drink     Flaxseed, Linseed, (EQL FLAX SEED OIL) 1000 MG CAPS Take 1,000 mg by mouth 2 (two) times daily.     Glutathione 500 MG CAPS Take 500 mg by mouth daily.     IRON PO Take 29 mg by mouth as needed.     Magnesium Oxide (MAG-CAPS PO) Take 200 mg by mouth daily.     Menaquinone-7 (VITAMIN K2 PO) Take 500 mcg by mouth.     VITAMIN A PO Take 1,500 mcg by mouth.     VITAMIN E PO Take 400 mg by mouth.     Zinc Sulfate (ZINC 15 PO) Take  30 mg by mouth daily.     No current facility-administered medications on file prior to visit.    Allergies:   Allergies  Allergen Reactions   Adhesive [Tape]     rash   Fish Allergy Swelling    Lip swelling   Other     PEANUTS - lip swelling      OBJECTIVE:  Physical Exam  There were no vitals filed for this visit. There is no height or weight on file to calculate BMI. No results found.   General: well developed, well nourished, seated, in no evident distress Head: head normocephalic and atraumatic.   Neck: supple with no carotid or supraclavicular bruits Cardiovascular: regular rate and rhythm, no murmurs Musculoskeletal: no deformity Skin:  no rash/petichiae Vascular:  Normal pulses all extremities   Neurologic Exam Mental Status: Awake and fully alert. Oriented to place and time. Recent and remote memory intact. Attention span, concentration and fund of knowledge appropriate. Mood and affect appropriate.  Cranial Nerves: Pupils equal, briskly reactive to light. Extraocular movements full without nystagmus. Visual fields full to confrontation. Hearing intact. Facial sensation intact. Face, tongue, palate moves normally and symmetrically.  Motor: Normal bulk and tone. Normal strength in all tested extremity muscles Gait and Station: Arises from chair without difficulty. Stance is normal. Gait demonstrates normal stride length and balance without use of AD. Tandem walk and heel toe without difficulty.         ASSESSMENT/PLAN: Emily Meyer is a 58 y.o. year old female    OSA on CPAP : Compliance report shows satisfactory usage with residual AHI 10.7. Adjust autoPAP settings from 7-13 with EPR 3 to 7-15 with EPR 3.   Discussed continued nightly usage with ensuring greater than 4 hours nightly for optimal benefit and per insurance purposes.  Continue to follow with DME company for any needed supplies or CPAP related concerns     Follow up in *** or call earlier  if needed   CC:  PCP: Alexander Iba, PA    I spent *** minutes of face-to-face and non-face-to-face time with patient.  This included previsit chart review, lab review, study review, order entry, electronic health record documentation, patient education and discussion regarding above diagnoses and treatment plan and answered all other questions to patient's satisfaction    Johny Nap, AGNP-BC  [facility] [phone]  [address] [place], [place] [address]  Phone [phone] Fax [phone] Note: This document was prepared with digital dictation and possible smart phrase technology. Any transcriptional errors that result from this process are unintentional.

## 2024-02-06 ENCOUNTER — Encounter: Payer: Self-pay | Admitting: Adult Health

## 2024-02-06 ENCOUNTER — Ambulatory Visit (INDEPENDENT_AMBULATORY_CARE_PROVIDER_SITE_OTHER): Admitting: Adult Health

## 2024-02-06 VITALS — BP 120/62 | HR 76 | Ht 65.0 in | Wt 168.0 lb

## 2024-02-06 DIAGNOSIS — G4733 Obstructive sleep apnea (adult) (pediatric): Secondary | ICD-10-CM | POA: Diagnosis not present

## 2024-02-06 DIAGNOSIS — G4734 Idiopathic sleep related nonobstructive alveolar hypoventilation: Secondary | ICD-10-CM | POA: Diagnosis not present

## 2024-02-06 NOTE — Patient Instructions (Addendum)
 Your Plan:  Will make pressure setting adjustments due to continued elevated residual AHI  Will request mask adjustment with your DME - they should contact you to schedule   Recommend completion of overnight oximetry testing on CPAP therapy to ensure resolution of nocturnal hypoxemia on CPAP therapy  Continue nightly use of CPAP with greater than 4 hours per night for optimal benefit and per insurance requirements  Continue to follow with your DME company [facility] for any needs supplies or CPAP related concerns      Follow-up in 6 months or call earlier if needed       Thank you for coming to see us  at [facility]. I hope we have been able to provide you high quality care today.  You may receive a patient satisfaction survey over the next few weeks. We would appreciate your feedback and comments so that we may continue to improve ourselves and the health of our patients.

## 2024-02-20 ENCOUNTER — Telehealth: Payer: Self-pay

## 2024-02-20 NOTE — Telephone Encounter (Signed)
 Report placed on NP desk for review as well

## 2024-02-21 NOTE — Telephone Encounter (Signed)
 Please advise patient that recent ONO showed resolution of nocturnal hypoxemia on CPAP therapy.  No need for supplemental oxygen.  Thank you.

## 2024-02-21 NOTE — Telephone Encounter (Signed)
 Spoke w/Pt regarding results of ONO. Informed Pt ONO showed resolution of nocturnal hypoxemia (when O2 % drops) on CPAP therapy, so no need for supplemental oxygen. Pt stated understanding. Reminded Pt to call if she has any questions. Pt voiced thankful for the call.

## 2024-06-20 ENCOUNTER — Encounter: Payer: Self-pay | Admitting: Adult Health

## 2024-08-05 ENCOUNTER — Encounter: Payer: Self-pay | Admitting: Physician Assistant

## 2024-08-19 LAB — HM MAMMOGRAPHY

## 2024-08-20 ENCOUNTER — Encounter: Payer: Self-pay | Admitting: Physician Assistant

## 2024-09-26 ENCOUNTER — Encounter: Payer: Self-pay | Admitting: Adult Health

## 2024-09-26 ENCOUNTER — Telehealth: Admitting: Adult Health

## 2024-09-26 DIAGNOSIS — G4733 Obstructive sleep apnea (adult) (pediatric): Secondary | ICD-10-CM

## 2024-09-26 NOTE — Progress Notes (Signed)
 [facility] [address] [place]. [place] [address] [phone]       OFFICE FOLLOW UP NOTE  Ms. Emersen Mascari Date of Birth:  1965/10/19 Medical Record Number:  [id number]    Primary neurologist: Dr. Buck Reason for visit: CPAP follow-up   Virtual Visit via Video Note  I connected with Hamna Asa on 09/26/2024 at  3:00 PM EST by a video enabled telemedicine application and verified that I am speaking with the correct person using two identifiers.  Location: Patient: at work Provider: in office, [facility]   I discussed the limitations of evaluation and management by telemedicine and the availability of in person appointments. The patient expressed understanding and agreed to proceed.    SUBJECTIVE:   Follow-up visit:  Prior visit: 02/06/2024  Brief HPI:   Baneza Bartoszek is a 58 y.o. female who was seen by Dr. Buck in 10/2023 for concern of underlying sleep apnea with complaints of snoring and excessive daytime somnolence. ESS 13/24. FSS 36/63. HST 11/2023 showed moderate sleep apnea with total AHI of 23.7/hr and O2 nadir of 68.2% with significant time below or at 88% saturation of over 30 minutes, indicated nocturnal hypoxemia. AutoPAP set up 01/05/2024 with settings at 6-12.  Settings gradually adjusted due to continued elevated residual AHI with improvement of residual apneas on setting of 7-16 with EPR 3.    Interval history:  Returns for CPAP compliance visit.  CPAP compliance report as below showing excellent usage and now optimal residual AHI on current pressure settings. Completed ONO which showed resolution of nocturnal hypoxemia on CPAP therapy.  Currently using F30i mask, she does ensure she uses nightly but does not enjoy wearing, she feels restricted with wearing the mask, feels like she is unable to move around and only able to sleep on her back. She has not previously tried nasal pillow or cradle type mask as she is a mouth breather but is interested  in trying. She also notes headgear causing neck discomfort.          ROS:   14 system review of systems performed and negative with exception of those listed in HPI  PMH:  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   Right Malar Cheek   Right sided facial pain     PSH:  Past Surgical History:  Procedure Laterality Date   JOINT REPLACEMENT  July 2021, May 2022   hips replaced   MOHS SURGERY  2021   TOTAL HIP ARTHROPLASTY      Social History:  Social History   Socioeconomic History   Marital status: Divorced    Spouse name: Not on file   Number of children: 2   Years of education: 14   Highest education level: Tax Adviser degree: occupational, scientist, product/process development, or vocational program  Occupational History   Occupation: Clinical Biochemist Rep    Employer: [employer]  Tobacco Use   Smoking status: Never   Smokeless tobacco: Never  Vaping Use   Vaping status: Never Used  Substance and Sexual Activity   Alcohol use: Never   Drug use: Never   Sexual activity: Not Currently    Birth control/protection: Post-menopausal  Other Topics Concern   Not on file  Social History Narrative   Divorced in 2018   Older daughter lives with her, currently unemployed   Lives with younger daughter  Administrator   Caffeine: never   Social Drivers of Health   Tobacco Use: Low Risk (02/06/2024)   Patient History    Smoking Tobacco Use: Never    Smokeless Tobacco Use: Never    Passive Exposure: Not on file  Financial Resource Strain: Not on file  Food Insecurity: Not on file  Transportation Needs: Not on file  Physical Activity: Not on file  Stress: Not on file  Social Connections: Not on file  Intimate Partner Violence: Not on file  Depression (PHQ2-9): Low Risk (09/27/2023)   Depression (PHQ2-9)    PHQ-2 Score: 0  Alcohol Screen: Not on file   Housing: Not on file  Utilities: Not on file  Health Literacy: Not on file    Family History:  Family History  Problem Relation Age of Onset   Healthy Mother    Hypertension Father    AAA (abdominal aortic aneurysm) Father    Early death Father    Heart disease Father    Diabetes Brother    Skin cancer Maternal Grandfather    Breast cancer Paternal Aunt    Colon cancer Neg Hx     Medications:   Current Outpatient Medications on File Prior to Visit  Medication Sig Dispense Refill   Ascorbic Acid (VITAMIN C PO) Take 1,000 mg by mouth.     ASTAXANTHIN PO Take 2 mg by mouth.     b complex vitamins tablet Take 1 tablet by mouth daily. Contains Thiamine 100 mg, Riboflavin 25 mg, Folate 680 mcg, B12 500 mcg, Biotin 400 mcg, Pantothenic Acid 200 mg     BETAINE PO Take 500 mg by mouth.     BIOTIN PO Take 10 mg by mouth.     CALCIUM PO Take 500 mg by mouth.     Cholecalciferol  (VITAMIN D3 PO) Take 5,000 Int'l Units by mouth.     Coenzyme Q10 (COQ-10 PO) Take 100 mg by mouth.     COLLAGEN PO Take by mouth. In a protein drink     Flaxseed, Linseed, (EQL FLAX SEED OIL) 1000 MG CAPS Take 1,000 mg by mouth 2 (two) times daily.     Glutathione 500 MG CAPS Take 500 mg by mouth daily.     IRON PO Take 29 mg by mouth as needed.     Magnesium Oxide (MAG-CAPS PO) Take 200 mg by mouth daily.     Menaquinone-7 (VITAMIN K2 PO) Take 500 mcg by mouth.     VITAMIN A PO Take 1,500 mcg by mouth.     VITAMIN E PO Take 400 mg by mouth.     Zinc Sulfate (ZINC 15 PO) Take 30 mg by mouth daily.     No current facility-administered medications on file prior to visit.    Allergies:   Allergies  Allergen Reactions   Adhesive [Tape]     rash   Fish Allergy Swelling    Lip swelling   Other     PEANUTS - lip swelling      OBJECTIVE:  Physical Exam  General: well developed, well nourished, very pleasant middle-age Caucasian female, seated, in no evident distress  Neurologic Exam Mental  Status: Awake and fully alert. Oriented to place and time. Recent and remote memory intact. Attention span, concentration and fund of knowledge appropriate. Mood and affect appropriate.        ASSESSMENT/PLAN: Elienai Gailey is a 58 y.o. year old female    OSA on CPAP :  Nocturnal hypoxemia:  Compliance report shows excellent usage with optimal residual AHI Continue autoPAP settings from 7-16 with EPR 3  Will request trial of Resmed Swift FX Bella Nasal Pillow Mask to help improve tolerance and decrease neck pain from headgear. Can use chin strap if needed ONO showed resolution of nocturnal hypoxemia on CPAP therapy Discussed continued nightly usage with ensuring greater than 4 hours nightly for optimal benefit and per insurance purposes.   Continue to follow with DME company [facility] for any needed supplies or CPAP related concerns CPAP set up 12/2023     Follow up in 1 year via MyChart video visit or call earlier if needed   CC:  PCP: Job Lukes, PA      Harlene Bogaert, AGNP-BC  [facility] [address] [place], [place] [address]  Phone [phone] Fax [phone] Note: This document was prepared with digital dictation and possible smart phrase technology. Any transcriptional errors that result from this process are unintentional.

## 2025-09-25 ENCOUNTER — Telehealth: Admitting: Adult Health
